# Patient Record
Sex: Female | Born: 1945 | Race: White | Hispanic: No | Marital: Single | Smoking: Never smoker
Health system: Southern US, Community
[De-identification: ages and names within clinical notes are randomized; demographics above are authoritative.]

## PROBLEM LIST (undated history)

## (undated) DIAGNOSIS — C801 Malignant (primary) neoplasm, unspecified: Secondary | ICD-10-CM

## (undated) HISTORY — PX: CHOLECYSTECTOMY: SHX55

## (undated) HISTORY — PX: MASTECTOMY: SHX3

## (undated) HISTORY — PX: HERNIA REPAIR: SHX51

---

## 1997-10-03 ENCOUNTER — Other Ambulatory Visit: Admission: RE | Admit: 1997-10-03 | Discharge: 1997-10-03 | Payer: Self-pay | Admitting: Obstetrics and Gynecology

## 1998-05-22 ENCOUNTER — Other Ambulatory Visit: Admission: RE | Admit: 1998-05-22 | Discharge: 1998-05-22 | Payer: Self-pay | Admitting: Urology

## 1999-09-17 ENCOUNTER — Other Ambulatory Visit: Admission: RE | Admit: 1999-09-17 | Discharge: 1999-09-17 | Payer: Self-pay | Admitting: Obstetrics and Gynecology

## 2000-06-16 ENCOUNTER — Encounter (INDEPENDENT_AMBULATORY_CARE_PROVIDER_SITE_OTHER): Payer: Self-pay | Admitting: Specialist

## 2000-06-16 ENCOUNTER — Other Ambulatory Visit: Admission: RE | Admit: 2000-06-16 | Discharge: 2000-06-16 | Payer: Self-pay | Admitting: Internal Medicine

## 2001-01-19 ENCOUNTER — Other Ambulatory Visit: Admission: RE | Admit: 2001-01-19 | Discharge: 2001-01-19 | Payer: Self-pay | Admitting: Obstetrics and Gynecology

## 2001-04-11 ENCOUNTER — Encounter: Admission: RE | Admit: 2001-04-11 | Discharge: 2001-04-11 | Payer: Self-pay | Admitting: Oncology

## 2001-04-11 ENCOUNTER — Encounter: Payer: Self-pay | Admitting: Oncology

## 2001-07-11 ENCOUNTER — Encounter: Payer: Self-pay | Admitting: Surgery

## 2001-07-11 ENCOUNTER — Observation Stay (HOSPITAL_COMMUNITY): Admission: RE | Admit: 2001-07-11 | Discharge: 2001-07-12 | Payer: Self-pay | Admitting: Surgery

## 2002-01-25 ENCOUNTER — Other Ambulatory Visit: Admission: RE | Admit: 2002-01-25 | Discharge: 2002-01-25 | Payer: Self-pay | Admitting: Obstetrics and Gynecology

## 2003-01-31 ENCOUNTER — Other Ambulatory Visit: Admission: RE | Admit: 2003-01-31 | Discharge: 2003-01-31 | Payer: Self-pay | Admitting: Obstetrics and Gynecology

## 2003-07-30 ENCOUNTER — Encounter: Admission: RE | Admit: 2003-07-30 | Discharge: 2003-07-30 | Payer: Self-pay | Admitting: Oncology

## 2004-03-20 ENCOUNTER — Encounter (INDEPENDENT_AMBULATORY_CARE_PROVIDER_SITE_OTHER): Payer: Self-pay | Admitting: *Deleted

## 2004-03-20 ENCOUNTER — Ambulatory Visit (HOSPITAL_COMMUNITY): Admission: RE | Admit: 2004-03-20 | Discharge: 2004-03-20 | Payer: Self-pay | Admitting: Obstetrics and Gynecology

## 2008-08-10 ENCOUNTER — Inpatient Hospital Stay (HOSPITAL_COMMUNITY): Admission: EM | Admit: 2008-08-10 | Discharge: 2008-08-18 | Payer: Self-pay | Admitting: Emergency Medicine

## 2008-08-10 ENCOUNTER — Ambulatory Visit: Payer: Self-pay | Admitting: Internal Medicine

## 2008-08-11 ENCOUNTER — Ambulatory Visit: Payer: Self-pay | Admitting: Internal Medicine

## 2008-08-13 ENCOUNTER — Encounter (INDEPENDENT_AMBULATORY_CARE_PROVIDER_SITE_OTHER): Payer: Self-pay | Admitting: Internal Medicine

## 2008-08-14 ENCOUNTER — Encounter (INDEPENDENT_AMBULATORY_CARE_PROVIDER_SITE_OTHER): Payer: Self-pay | Admitting: Surgery

## 2008-08-30 ENCOUNTER — Encounter: Payer: Self-pay | Admitting: Internal Medicine

## 2010-06-23 NOTE — Letter (Signed)
Summary: Follow Up/[facility]  Follow Up/[facility]   Imported By: Sherian Rein 09/16/2008 09:24:10  _____________________________________________________________________  External Attachment:    Type:   Image     Comment:   External Document

## 2010-09-03 LAB — DIFFERENTIAL
Basophils Absolute: 0 10*3/uL (ref 0.0–0.1)
Basophils Absolute: 0.1 10*3/uL (ref 0.0–0.1)
Basophils Absolute: 0.1 10*3/uL (ref 0.0–0.1)
Basophils Absolute: 0.1 10*3/uL (ref 0.0–0.1)
Basophils Absolute: 0.2 10*3/uL — ABNORMAL HIGH (ref 0.0–0.1)
Basophils Absolute: 0.2 10*3/uL — ABNORMAL HIGH (ref 0.0–0.1)
Basophils Relative: 0 % (ref 0–1)
Basophils Relative: 0 % (ref 0–1)
Basophils Relative: 0 % (ref 0–1)
Basophils Relative: 0 % (ref 0–1)
Basophils Relative: 1 % (ref 0–1)
Basophils Relative: 1 % (ref 0–1)
Eosinophils Absolute: 0 10*3/uL (ref 0.0–0.7)
Eosinophils Absolute: 0 10*3/uL (ref 0.0–0.7)
Eosinophils Absolute: 0 10*3/uL (ref 0.0–0.7)
Eosinophils Absolute: 0.2 10*3/uL (ref 0.0–0.7)
Eosinophils Absolute: 0.2 10*3/uL (ref 0.0–0.7)
Eosinophils Absolute: 0.3 10*3/uL (ref 0.0–0.7)
Eosinophils Relative: 0 % (ref 0–5)
Eosinophils Relative: 0 % (ref 0–5)
Eosinophils Relative: 0 % (ref 0–5)
Eosinophils Relative: 1 % (ref 0–5)
Eosinophils Relative: 1 % (ref 0–5)
Eosinophils Relative: 2 % (ref 0–5)
Lymphocytes Relative: 4 % — ABNORMAL LOW (ref 12–46)
Lymphocytes Relative: 5 % — ABNORMAL LOW (ref 12–46)
Lymphocytes Relative: 7 % — ABNORMAL LOW (ref 12–46)
Lymphocytes Relative: 7 % — ABNORMAL LOW (ref 12–46)
Lymphocytes Relative: 8 % — ABNORMAL LOW (ref 12–46)
Lymphocytes Relative: 9 % — ABNORMAL LOW (ref 12–46)
Lymphs Abs: 0.6 10*3/uL — ABNORMAL LOW (ref 0.7–4.0)
Lymphs Abs: 0.8 10*3/uL (ref 0.7–4.0)
Lymphs Abs: 1.2 10*3/uL (ref 0.7–4.0)
Lymphs Abs: 1.3 10*3/uL (ref 0.7–4.0)
Lymphs Abs: 1.4 10*3/uL (ref 0.7–4.0)
Lymphs Abs: 1.6 10*3/uL (ref 0.7–4.0)
Monocytes Absolute: 0.8 10*3/uL (ref 0.1–1.0)
Monocytes Absolute: 0.9 10*3/uL (ref 0.1–1.0)
Monocytes Absolute: 0.9 10*3/uL (ref 0.1–1.0)
Monocytes Absolute: 1 10*3/uL (ref 0.1–1.0)
Monocytes Absolute: 1 10*3/uL (ref 0.1–1.0)
Monocytes Absolute: 1.5 10*3/uL — ABNORMAL HIGH (ref 0.1–1.0)
Monocytes Relative: 5 % (ref 3–12)
Monocytes Relative: 5 % (ref 3–12)
Monocytes Relative: 5 % (ref 3–12)
Monocytes Relative: 6 % (ref 3–12)
Monocytes Relative: 6 % (ref 3–12)
Monocytes Relative: 8 % (ref 3–12)
Neutro Abs: 13.6 10*3/uL — ABNORMAL HIGH (ref 1.7–7.7)
Neutro Abs: 14.3 10*3/uL — ABNORMAL HIGH (ref 1.7–7.7)
Neutro Abs: 14.5 10*3/uL — ABNORMAL HIGH (ref 1.7–7.7)
Neutro Abs: 14.7 10*3/uL — ABNORMAL HIGH (ref 1.7–7.7)
Neutro Abs: 15.1 10*3/uL — ABNORMAL HIGH (ref 1.7–7.7)
Neutro Abs: 15.7 10*3/uL — ABNORMAL HIGH (ref 1.7–7.7)
Neutrophils Relative %: 84 % — ABNORMAL HIGH (ref 43–77)
Neutrophils Relative %: 84 % — ABNORMAL HIGH (ref 43–77)
Neutrophils Relative %: 85 % — ABNORMAL HIGH (ref 43–77)
Neutrophils Relative %: 86 % — ABNORMAL HIGH (ref 43–77)
Neutrophils Relative %: 90 % — ABNORMAL HIGH (ref 43–77)
Neutrophils Relative %: 90 % — ABNORMAL HIGH (ref 43–77)

## 2010-09-03 LAB — COMPREHENSIVE METABOLIC PANEL
ALT: 143 U/L — ABNORMAL HIGH (ref 0–35)
ALT: 271 U/L — ABNORMAL HIGH (ref 0–35)
ALT: 32 U/L (ref 0–35)
ALT: 379 U/L — ABNORMAL HIGH (ref 0–35)
ALT: 41 U/L — ABNORMAL HIGH (ref 0–35)
ALT: 53 U/L — ABNORMAL HIGH (ref 0–35)
ALT: 60 U/L — ABNORMAL HIGH (ref 0–35)
ALT: 67 U/L — ABNORMAL HIGH (ref 0–35)
ALT: 86 U/L — ABNORMAL HIGH (ref 0–35)
AST: 195 U/L — ABNORMAL HIGH (ref 0–37)
AST: 22 U/L (ref 0–37)
AST: 23 U/L (ref 0–37)
AST: 326 U/L — ABNORMAL HIGH (ref 0–37)
AST: 34 U/L (ref 0–37)
AST: 43 U/L — ABNORMAL HIGH (ref 0–37)
AST: 53 U/L — ABNORMAL HIGH (ref 0–37)
AST: 71 U/L — ABNORMAL HIGH (ref 0–37)
AST: 74 U/L — ABNORMAL HIGH (ref 0–37)
Albumin: 2 g/dL — ABNORMAL LOW (ref 3.5–5.2)
Albumin: 2.2 g/dL — ABNORMAL LOW (ref 3.5–5.2)
Albumin: 2.2 g/dL — ABNORMAL LOW (ref 3.5–5.2)
Albumin: 2.2 g/dL — ABNORMAL LOW (ref 3.5–5.2)
Albumin: 2.3 g/dL — ABNORMAL LOW (ref 3.5–5.2)
Albumin: 2.5 g/dL — ABNORMAL LOW (ref 3.5–5.2)
Albumin: 2.9 g/dL — ABNORMAL LOW (ref 3.5–5.2)
Albumin: 3.6 g/dL (ref 3.5–5.2)
Albumin: 4.3 g/dL (ref 3.5–5.2)
Alkaline Phosphatase: 130 U/L — ABNORMAL HIGH (ref 39–117)
Alkaline Phosphatase: 163 U/L — ABNORMAL HIGH (ref 39–117)
Alkaline Phosphatase: 74 U/L (ref 39–117)
Alkaline Phosphatase: 77 U/L (ref 39–117)
Alkaline Phosphatase: 84 U/L (ref 39–117)
Alkaline Phosphatase: 87 U/L (ref 39–117)
Alkaline Phosphatase: 91 U/L (ref 39–117)
Alkaline Phosphatase: 92 U/L (ref 39–117)
Alkaline Phosphatase: 95 U/L (ref 39–117)
BUN: 13 mg/dL (ref 6–23)
BUN: 13 mg/dL (ref 6–23)
BUN: 18 mg/dL (ref 6–23)
BUN: 20 mg/dL (ref 6–23)
BUN: 21 mg/dL (ref 6–23)
BUN: 23 mg/dL (ref 6–23)
BUN: 23 mg/dL (ref 6–23)
BUN: 25 mg/dL — ABNORMAL HIGH (ref 6–23)
BUN: 8 mg/dL (ref 6–23)
CO2: 20 mEq/L (ref 19–32)
CO2: 21 mEq/L (ref 19–32)
CO2: 22 mEq/L (ref 19–32)
CO2: 22 mEq/L (ref 19–32)
CO2: 23 mEq/L (ref 19–32)
CO2: 25 mEq/L (ref 19–32)
CO2: 26 mEq/L (ref 19–32)
CO2: 27 mEq/L (ref 19–32)
CO2: 29 mEq/L (ref 19–32)
Calcium: 7.4 mg/dL — ABNORMAL LOW (ref 8.4–10.5)
Calcium: 7.5 mg/dL — ABNORMAL LOW (ref 8.4–10.5)
Calcium: 7.5 mg/dL — ABNORMAL LOW (ref 8.4–10.5)
Calcium: 7.6 mg/dL — ABNORMAL LOW (ref 8.4–10.5)
Calcium: 7.6 mg/dL — ABNORMAL LOW (ref 8.4–10.5)
Calcium: 7.8 mg/dL — ABNORMAL LOW (ref 8.4–10.5)
Calcium: 7.9 mg/dL — ABNORMAL LOW (ref 8.4–10.5)
Calcium: 8 mg/dL — ABNORMAL LOW (ref 8.4–10.5)
Calcium: 9.7 mg/dL (ref 8.4–10.5)
Chloride: 102 mEq/L (ref 96–112)
Chloride: 103 mEq/L (ref 96–112)
Chloride: 104 mEq/L (ref 96–112)
Chloride: 105 mEq/L (ref 96–112)
Chloride: 109 mEq/L (ref 96–112)
Chloride: 110 mEq/L (ref 96–112)
Chloride: 111 mEq/L (ref 96–112)
Chloride: 112 mEq/L (ref 96–112)
Chloride: 114 mEq/L — ABNORMAL HIGH (ref 96–112)
Creatinine, Ser: 0.95 mg/dL (ref 0.4–1.2)
Creatinine, Ser: 0.96 mg/dL (ref 0.4–1.2)
Creatinine, Ser: 0.99 mg/dL (ref 0.4–1.2)
Creatinine, Ser: 1.07 mg/dL (ref 0.4–1.2)
Creatinine, Ser: 1.08 mg/dL (ref 0.4–1.2)
Creatinine, Ser: 1.13 mg/dL (ref 0.4–1.2)
Creatinine, Ser: 1.14 mg/dL (ref 0.4–1.2)
Creatinine, Ser: 1.15 mg/dL (ref 0.4–1.2)
Creatinine, Ser: 1.22 mg/dL — ABNORMAL HIGH (ref 0.4–1.2)
GFR calc Af Amer: 54 mL/min — ABNORMAL LOW (ref >60)
GFR calc Af Amer: 57 mL/min — ABNORMAL LOW (ref >60)
GFR calc Af Amer: 58 mL/min — ABNORMAL LOW (ref >60)
GFR calc Af Amer: 58 mL/min — ABNORMAL LOW (ref >60)
GFR calc Af Amer: >60 mL/min (ref >60)
GFR calc Af Amer: >60 mL/min (ref >60)
GFR calc Af Amer: >60 mL/min (ref >60)
GFR calc Af Amer: >60 mL/min (ref >60)
GFR calc Af Amer: >60 mL/min (ref >60)
GFR calc non Af Amer: 44 mL/min — ABNORMAL LOW (ref >60)
GFR calc non Af Amer: 47 mL/min — ABNORMAL LOW (ref >60)
GFR calc non Af Amer: 48 mL/min — ABNORMAL LOW (ref >60)
GFR calc non Af Amer: 48 mL/min — ABNORMAL LOW (ref >60)
GFR calc non Af Amer: 51 mL/min — ABNORMAL LOW (ref >60)
GFR calc non Af Amer: 51 mL/min — ABNORMAL LOW (ref >60)
GFR calc non Af Amer: 56 mL/min — ABNORMAL LOW (ref >60)
GFR calc non Af Amer: 58 mL/min — ABNORMAL LOW (ref >60)
GFR calc non Af Amer: 59 mL/min — ABNORMAL LOW (ref >60)
Glucose, Bld: 143 mg/dL — ABNORMAL HIGH (ref 70–99)
Glucose, Bld: 212 mg/dL — ABNORMAL HIGH (ref 70–99)
Glucose, Bld: 247 mg/dL — ABNORMAL HIGH (ref 70–99)
Glucose, Bld: 257 mg/dL — ABNORMAL HIGH (ref 70–99)
Glucose, Bld: 279 mg/dL — ABNORMAL HIGH (ref 70–99)
Glucose, Bld: 290 mg/dL — ABNORMAL HIGH (ref 70–99)
Glucose, Bld: 291 mg/dL — ABNORMAL HIGH (ref 70–99)
Glucose, Bld: 307 mg/dL — ABNORMAL HIGH (ref 70–99)
Glucose, Bld: 327 mg/dL — ABNORMAL HIGH (ref 70–99)
Potassium: 3.4 mEq/L — ABNORMAL LOW (ref 3.5–5.1)
Potassium: 3.5 mEq/L (ref 3.5–5.1)
Potassium: 3.5 mEq/L (ref 3.5–5.1)
Potassium: 3.6 mEq/L (ref 3.5–5.1)
Potassium: 4.1 mEq/L (ref 3.5–5.1)
Potassium: 4.2 mEq/L (ref 3.5–5.1)
Potassium: 4.6 mEq/L (ref 3.5–5.1)
Potassium: 4.6 mEq/L (ref 3.5–5.1)
Potassium: 5.5 mEq/L — ABNORMAL HIGH (ref 3.5–5.1)
Sodium: 136 mEq/L (ref 135–145)
Sodium: 139 mEq/L (ref 135–145)
Sodium: 139 mEq/L (ref 135–145)
Sodium: 139 mEq/L (ref 135–145)
Sodium: 141 mEq/L (ref 135–145)
Sodium: 142 mEq/L (ref 135–145)
Sodium: 142 mEq/L (ref 135–145)
Sodium: 142 mEq/L (ref 135–145)
Sodium: 143 mEq/L (ref 135–145)
Total Bilirubin: 0.8 mg/dL (ref 0.3–1.2)
Total Bilirubin: 1.1 mg/dL (ref 0.3–1.2)
Total Bilirubin: 1.2 mg/dL (ref 0.3–1.2)
Total Bilirubin: 1.5 mg/dL — ABNORMAL HIGH (ref 0.3–1.2)
Total Bilirubin: 2.2 mg/dL — ABNORMAL HIGH (ref 0.3–1.2)
Total Bilirubin: 2.3 mg/dL — ABNORMAL HIGH (ref 0.3–1.2)
Total Bilirubin: 3.2 mg/dL — ABNORMAL HIGH (ref 0.3–1.2)
Total Bilirubin: 4.3 mg/dL — ABNORMAL HIGH (ref 0.3–1.2)
Total Bilirubin: 5.1 mg/dL — ABNORMAL HIGH (ref 0.3–1.2)
Total Protein: 4.7 g/dL — ABNORMAL LOW (ref 6.0–8.3)
Total Protein: 5 g/dL — ABNORMAL LOW (ref 6.0–8.3)
Total Protein: 5.3 g/dL — ABNORMAL LOW (ref 6.0–8.3)
Total Protein: 5.4 g/dL — ABNORMAL LOW (ref 6.0–8.3)
Total Protein: 5.7 g/dL — ABNORMAL LOW (ref 6.0–8.3)
Total Protein: 5.9 g/dL — ABNORMAL LOW (ref 6.0–8.3)
Total Protein: 6 g/dL (ref 6.0–8.3)
Total Protein: 6.7 g/dL (ref 6.0–8.3)
Total Protein: 8 g/dL (ref 6.0–8.3)

## 2010-09-03 LAB — GLUCOSE, CAPILLARY
Glucose-Capillary: 182 mg/dL — ABNORMAL HIGH (ref 70–99)
Glucose-Capillary: 221 mg/dL — ABNORMAL HIGH (ref 70–99)
Glucose-Capillary: 226 mg/dL — ABNORMAL HIGH (ref 70–99)
Glucose-Capillary: 230 mg/dL — ABNORMAL HIGH (ref 70–99)
Glucose-Capillary: 236 mg/dL — ABNORMAL HIGH (ref 70–99)
Glucose-Capillary: 238 mg/dL — ABNORMAL HIGH (ref 70–99)
Glucose-Capillary: 239 mg/dL — ABNORMAL HIGH (ref 70–99)
Glucose-Capillary: 240 mg/dL — ABNORMAL HIGH (ref 70–99)
Glucose-Capillary: 251 mg/dL — ABNORMAL HIGH (ref 70–99)
Glucose-Capillary: 264 mg/dL — ABNORMAL HIGH (ref 70–99)
Glucose-Capillary: 264 mg/dL — ABNORMAL HIGH (ref 70–99)
Glucose-Capillary: 273 mg/dL — ABNORMAL HIGH (ref 70–99)
Glucose-Capillary: 289 mg/dL — ABNORMAL HIGH (ref 70–99)
Glucose-Capillary: 289 mg/dL — ABNORMAL HIGH (ref 70–99)
Glucose-Capillary: 289 mg/dL — ABNORMAL HIGH (ref 70–99)
Glucose-Capillary: 292 mg/dL — ABNORMAL HIGH (ref 70–99)
Glucose-Capillary: 304 mg/dL — ABNORMAL HIGH (ref 70–99)
Glucose-Capillary: 312 mg/dL — ABNORMAL HIGH (ref 70–99)
Glucose-Capillary: 315 mg/dL — ABNORMAL HIGH (ref 70–99)
Glucose-Capillary: 317 mg/dL — ABNORMAL HIGH (ref 70–99)
Glucose-Capillary: 323 mg/dL — ABNORMAL HIGH (ref 70–99)
Glucose-Capillary: 328 mg/dL — ABNORMAL HIGH (ref 70–99)
Glucose-Capillary: 328 mg/dL — ABNORMAL HIGH (ref 70–99)
Glucose-Capillary: 328 mg/dL — ABNORMAL HIGH (ref 70–99)
Glucose-Capillary: 330 mg/dL — ABNORMAL HIGH (ref 70–99)
Glucose-Capillary: 354 mg/dL — ABNORMAL HIGH (ref 70–99)
Glucose-Capillary: 357 mg/dL — ABNORMAL HIGH (ref 70–99)
Glucose-Capillary: 360 mg/dL — ABNORMAL HIGH (ref 70–99)
Glucose-Capillary: 366 mg/dL — ABNORMAL HIGH (ref 70–99)

## 2010-09-03 LAB — CBC
HCT: 30.6 % — ABNORMAL LOW (ref 36.0–46.0)
HCT: 30.9 % — ABNORMAL LOW (ref 36.0–46.0)
HCT: 31 % — ABNORMAL LOW (ref 36.0–46.0)
HCT: 32.7 % — ABNORMAL LOW (ref 36.0–46.0)
HCT: 33.9 % — ABNORMAL LOW (ref 36.0–46.0)
HCT: 34.4 % — ABNORMAL LOW (ref 36.0–46.0)
HCT: 39.9 % (ref 36.0–46.0)
HCT: 45.6 % (ref 36.0–46.0)
HCT: 49.5 % — ABNORMAL HIGH (ref 36.0–46.0)
Hemoglobin: 10.4 g/dL — ABNORMAL LOW (ref 12.0–15.0)
Hemoglobin: 10.4 g/dL — ABNORMAL LOW (ref 12.0–15.0)
Hemoglobin: 10.5 g/dL — ABNORMAL LOW (ref 12.0–15.0)
Hemoglobin: 10.9 g/dL — ABNORMAL LOW (ref 12.0–15.0)
Hemoglobin: 11.5 g/dL — ABNORMAL LOW (ref 12.0–15.0)
Hemoglobin: 11.6 g/dL — ABNORMAL LOW (ref 12.0–15.0)
Hemoglobin: 13.6 g/dL (ref 12.0–15.0)
Hemoglobin: 15.4 g/dL — ABNORMAL HIGH (ref 12.0–15.0)
Hemoglobin: 16.6 g/dL — ABNORMAL HIGH (ref 12.0–15.0)
MCHC: 33.4 g/dL (ref 30.0–36.0)
MCHC: 33.5 g/dL (ref 30.0–36.0)
MCHC: 33.5 g/dL (ref 30.0–36.0)
MCHC: 33.5 g/dL (ref 30.0–36.0)
MCHC: 33.8 g/dL (ref 30.0–36.0)
MCHC: 33.8 g/dL (ref 30.0–36.0)
MCHC: 34 g/dL (ref 30.0–36.0)
MCHC: 34.2 g/dL (ref 30.0–36.0)
MCHC: 34.4 g/dL (ref 30.0–36.0)
MCV: 92.9 fL (ref 78.0–100.0)
MCV: 93.1 fL (ref 78.0–100.0)
MCV: 93.7 fL (ref 78.0–100.0)
MCV: 93.8 fL (ref 78.0–100.0)
MCV: 93.9 fL (ref 78.0–100.0)
MCV: 94.2 fL (ref 78.0–100.0)
MCV: 94.3 fL (ref 78.0–100.0)
MCV: 94.7 fL (ref 78.0–100.0)
MCV: 95.3 fL (ref 78.0–100.0)
Platelets: 202 10*3/uL (ref 150–400)
Platelets: 206 10*3/uL (ref 150–400)
Platelets: 213 10*3/uL (ref 150–400)
Platelets: 226 10*3/uL (ref 150–400)
Platelets: 228 10*3/uL (ref 150–400)
Platelets: 242 10*3/uL (ref 150–400)
Platelets: 256 10*3/uL (ref 150–400)
Platelets: 263 10*3/uL (ref 150–400)
Platelets: 303 10*3/uL (ref 150–400)
RBC: 3.25 MIL/uL — ABNORMAL LOW (ref 3.87–5.11)
RBC: 3.27 MIL/uL — ABNORMAL LOW (ref 3.87–5.11)
RBC: 3.28 MIL/uL — ABNORMAL LOW (ref 3.87–5.11)
RBC: 3.49 MIL/uL — ABNORMAL LOW (ref 3.87–5.11)
RBC: 3.61 MIL/uL — ABNORMAL LOW (ref 3.87–5.11)
RBC: 3.64 MIL/uL — ABNORMAL LOW (ref 3.87–5.11)
RBC: 4.3 MIL/uL (ref 3.87–5.11)
RBC: 4.85 MIL/uL (ref 3.87–5.11)
RBC: 5.28 MIL/uL — ABNORMAL HIGH (ref 3.87–5.11)
RDW: 13.6 % (ref 11.5–15.5)
RDW: 13.6 % (ref 11.5–15.5)
RDW: 13.9 % (ref 11.5–15.5)
RDW: 14.1 % (ref 11.5–15.5)
RDW: 14.2 % (ref 11.5–15.5)
RDW: 14.2 % (ref 11.5–15.5)
RDW: 14.3 % (ref 11.5–15.5)
RDW: 14.4 % (ref 11.5–15.5)
RDW: 14.4 % (ref 11.5–15.5)
WBC: 13.6 10*3/uL — ABNORMAL HIGH (ref 4.0–10.5)
WBC: 13.7 10*3/uL — ABNORMAL HIGH (ref 4.0–10.5)
WBC: 15.2 10*3/uL — ABNORMAL HIGH (ref 4.0–10.5)
WBC: 16.7 10*3/uL — ABNORMAL HIGH (ref 4.0–10.5)
WBC: 17.2 10*3/uL — ABNORMAL HIGH (ref 4.0–10.5)
WBC: 17.3 10*3/uL — ABNORMAL HIGH (ref 4.0–10.5)
WBC: 17.5 10*3/uL — ABNORMAL HIGH (ref 4.0–10.5)
WBC: 18.1 10*3/uL — ABNORMAL HIGH (ref 4.0–10.5)
WBC: 18.4 10*3/uL — ABNORMAL HIGH (ref 4.0–10.5)

## 2010-09-03 LAB — LIPASE, BLOOD
Lipase: 1249 U/L — ABNORMAL HIGH (ref 11–59)
Lipase: 1368 U/L — ABNORMAL HIGH (ref 11–59)
Lipase: 21 U/L (ref 11–59)
Lipase: 26 U/L (ref 11–59)
Lipase: 27 U/L (ref 11–59)
Lipase: 30 U/L (ref 11–59)
Lipase: 341 U/L — ABNORMAL HIGH (ref 11–59)
Lipase: 79 U/L — ABNORMAL HIGH (ref 11–59)
Lipase: >2000 U/L — ABNORMAL HIGH (ref 11–59)

## 2010-09-03 LAB — URINALYSIS, ROUTINE W REFLEX MICROSCOPIC
Glucose, UA: 500 mg/dL — AB
Glucose, UA: NEGATIVE mg/dL
Hgb urine dipstick: NEGATIVE
Ketones, ur: 15 mg/dL — AB
Nitrite: NEGATIVE
Nitrite: POSITIVE — AB
Protein, ur: >300 mg/dL — AB
Protein, ur: NEGATIVE mg/dL
Specific Gravity, Urine: 1.018 (ref 1.005–1.030)
Specific Gravity, Urine: 1.029 (ref 1.005–1.030)
Urobilinogen, UA: 1 mg/dL (ref 0.0–1.0)
Urobilinogen, UA: 1 mg/dL (ref 0.0–1.0)
pH: 5.5 (ref 5.0–8.0)
pH: 6 (ref 5.0–8.0)

## 2010-09-03 LAB — URINE MICROSCOPIC-ADD ON

## 2010-09-03 LAB — CULTURE, BLOOD (ROUTINE X 2)
Culture: NO GROWTH
Culture: NO GROWTH

## 2010-09-03 LAB — MAGNESIUM: Magnesium: 1.9 mg/dL (ref 1.5–2.5)

## 2010-09-03 LAB — URINE CULTURE
Colony Count: NO GROWTH
Culture: NO GROWTH
Special Requests: NEGATIVE

## 2010-09-03 LAB — HEMOGLOBIN A1C
Hgb A1c MFr Bld: 5.8 % (ref 4.6–6.1)
Mean Plasma Glucose: 120 mg/dL

## 2010-09-03 LAB — LACTIC ACID, PLASMA: Lactic Acid, Venous: 2.4 mmol/L — ABNORMAL HIGH (ref 0.5–2.2)

## 2010-09-03 LAB — TROPONIN I: Troponin I: 0.02 ng/mL (ref 0.00–0.06)

## 2010-09-03 LAB — TSH: TSH: 0.76 u[IU]/mL (ref 0.350–4.500)

## 2010-10-06 NOTE — H&P (Signed)
NAMELANDYN, LORINCZ               ACCOUNT NO.:  [id number]   MEDICAL RECORD NO.:  [id number]          PATIENT TYPE:  EMS   LOCATION:  ED                           FACILITY:  [facility]   PHYSICIAN:  Georgina Quint. Plotnikov, MDDATE OF BIRTH:  January 26, 1946   DATE OF ADMISSION:  08/10/2008  DATE OF DISCHARGE:                              HISTORY & PHYSICAL   CHIEF COMPLAINT:  Abdominal pain.   HISTORY OF PRESENT ILLNESS:  The patient is a 65 year old female who  started to have upper abdominal and low back pain on Thursday, has  resolved, and resumed on Friday.  She attributes the pain to working in  the yard.  The pain has gotten progressively worse and was unbearable  today starting morning hours.  She had a couple toasts for breakfast  with cranberry juice.  It all came up.  She vomited about 6 times.  She  had very severe abdominal pain radiating into the back.  She rated it  10/10.  There was no blood in the emesis.  No diarrhea.  No chest pain.  No syncope.  She has been feeling tired.  Dilaudid given in the  emergency room along with nausea medication with minimal relief.  She  got Zosyn intravenously.   PAST MEDICAL HISTORY:  1. Hypertension.  2. Hypothyroidism.  3. Breast cancer 20 years ago with recurrence.  4. Frequent urinary tract infections.   SURGERIES:  1. Hernia.  2. Breast reconstruction bilaterally.   ALLERGIES:  NONE.   MEDICINES:  1. Lisinopril HCT 10/12.5 daily.  2. Levoxyl 75 mcg daily.  3. Clorazepate 7.5 mg at h.s.  4. Bactrim p.r.n. UTIs.   FAMILY HISTORY:  Negative for gallstones.   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  She is married.   REVIEW OF SYSTEMS:  No chest pain or shortness of breath.  No syncope.  GI symptoms as above.  The rest of the 18-point review of systems is  negative.   PHYSICAL:  Temp 97.2.  Blood pressure 108/30.  Heart rate 56.  Respirations 20.  Saturations 92%.  She appears in mild acute  distress  due to pain.  HEENT:  With dry lips and dry oral mucosa.  No jaundice.  NECK:  Supple.  No meningeal signs.  No thyromegaly.  LUNGS:  Clear.  No wheezes or rales.  HEART:  S1 and S2.  No gallop.  She is bradycardic.  No murmur.  ABDOMEN:  Slightly distended.  Tender in the upper mid section.  No  hernias.  No masses.  No rebound symptoms.  LS SPINE:  Without tenderness.  LOWER EXTREMITIES:  Without edema.  Calves nontender.  SKIN:  Without rashes.  She is alert, oriented, and cooperative.  Cranial nerves II-XII  nonfocal.  Deep tendinous muscle strength within normal limits.   LABS:  EKG with sinus bradycardia.  White count 17.2 thousand, MCV 93.9,  platelets 303,000, hemoglobin 16.6, sodium 141, potassium 4.6,  creatinine 0.99, glucose 143, BUN 8, AST 326, ALT 379, troponin 0.02,  lactic acid 2.4, lipase  greater than 2000.  Urinalysis with 3 to 6 WBCs.  KUB, nonobstructive bowel gas pattern.  Lungs clear.  Abdominal  ultrasound, gallstones in gallbladder, common bile duct 7.8 mm, fatty  liver infiltration.   ASSESSMENT AND PLAN:  1. Acute pancreatitis, likely gallstone related.  We will admit to      step down.  N.p.o.  Intravenous fluids.  We will obtain      gastrointestinal consult.  2. Elevated liver function tests due to the above.  We will obtain      liver tests tomorrow including bilirubin and alk phos.  3. Gallstones in gallbladder.  Plan as above.  May need magnetic      resonance cholangiopancreatography/endoscopic retrograde      cholangiopancreatography.  4. Nausea/vomiting.  We will treat with Zofran.  5. Dehydration.  Intravenous fluids.  6. Elevated lactic acid.  Plan as above.  7. Abdominal pain due to #1.  Plan as above.  We will cover with      Zosyn.  8. Elevated white cell count.  Plan as above.      Georgina Quint. Plotnikov, MD  Electronically Signed     AVP/MEDQ  D:  08/10/2008  T:  08/11/2008  Job:  [id number]   cc:   Molly Maduro L. Foy [place], M.D.   Fax: [phone]   Hedwig Morton. Juanda Chance, MD  [address]  [place]  [place] [address]

## 2010-10-06 NOTE — Consult Note (Signed)
[name], [name]               ACCOUNT NO.:  [id number]   MEDICAL RECORD NO.:  [id number]          PATIENT TYPE:  INP   LOCATION:  1226                         FACILITY:  [facility]   PHYSICIAN:  Iva Boop, MD,FACGDATE OF BIRTH:  1945-09-26   DATE OF CONSULTATION:  08/11/2008  DATE OF DISCHARGE:                                 CONSULTATION   REQUESTING PHYSICIAN:  Georgina Quint. Plotnikov, MD.  He was covering for  Dr. Marinda Elk who is the primary care physician.   REASON FOR CONSULTATION:  Pancreatitis and gallstones.   ASSESSMENT:  [age]-year-old white woman with gallstone  pancreatitis.  She is improved at this point.  She has a 7.8 mm common  bile duct.  She had a bilirubin of 5.1, this is down to 4.3 today.  Her  lipase is down to 1368 from greater than 2000.   At this point it is not entirely clear whether she has passed a stone or  has a retained common bile duct stone.  The trend is towards improving  liver function studies, would suggest that she has passed it.   PLAN:  Continue supportive care with pain control.  She is on  intravenous Zosyn for antibiotic coverage of the biliary sepsis.  Dr.  Michaell Cowing of Surgery has been consulted.  We will observe how the patient  does over time and decide whether or not a preoperative endoscopic  retrograde cholangiopancreatography or other imaging would be indicated,  it is too soon to tell for that and I do not think surgery or endoscopic  retrograde cholangiopancreatography is indicated today.  I have  discussed this with Dr. Michaell Cowing and he will come to see the patient later.   HISTORY:  [age]-year-old white woman who was in her usual state of  health until about 4 days prior to admission when she had right upper  quadrant pain radiating into the abdomen, some in the back.  She took  some Tylenol and felt better.  Tums did not help.  She has lost her  appetite and yesterday she had sudden severe increase in the pain  with  nausea, vomiting and sweats.  She presented to the emergency department  where workup revealed the above findings.  She was admitted to the  hospital and placed on a morphine PCA and that is helping, but she was  still having a fair amount of pain.  The nausea and vomiting appear to  have subsided.  She has not been febrile.   PAST MEDICAL HISTORY:  1. Breast cancer with mastectomy and trans flap reconstruction late      1980s.  2. Ventral hernia repair with mesh, Dr. Jamey Ripa.  3. Hypothyroidism.  4. Obesity.  5. Uterine endometrial polyps.   MEDICATIONS:  1. Morphine PCA.  2. Protonix 40 mg IV.  3. NovoLog insulin.  4. Synthroid is being held.  5. Topical hydrocortisone.  6. Catapres patch.   ALLERGIES:  ALLOPURINOL AND ANTIBIOTICS SENSITIVITIES.  Details not  listed.  No  true drug allergies though.   SOCIAL HISTORY:  Homemaker, nonsmoker, married.  She lives  in  [place].  Rare alcohol.   FAMILY HISTORY:  Mother had breast cancer.  She had a son die of colon  cancer.  Diabetes in the family as well.   REVIEW OF SYSTEMS:  All other systems negative except as mentioned in  the HPI.   PHYSICAL EXAM:  Obese white woman in some pain but no acute distress.  Temperature 98.5, pulse 89 and respirations 18.  Weight 105.5 kg.  Blood  pressure is 152/56.  The eyes are anicteric.  Mouth clear and moist.  No lesions.  NECK:  Without mass.  LUNGS:  Clear.  HEART:  S1, S2.  I hear no rubs, murmurs or gallops.  ABDOMEN:  Obese, soft.  She is moderately tender in the epigastric area  and upper quadrants without organomegaly, mass or guarding.  Bowel  sounds are decreased.  EXTREMITIES:  Without cyanosis, clubbing or edema.  SKIN:  Without jaundice.  NEUROLOGIC:  She is alert and oriented x4.  Moves all extremities well.  PSYCHIATRIC/PSYCHOLOGIC:  Affect appropriate.   LABORATORY DATA:  Her laboratories as reflected above in the assessment.  Additionally, alk phos today  130, AST 195, ALT 271.  Yesterday those  numbers were 163, 326 and 379.  She had a troponin and a lactic acid  that were negative at 2.4 (slightly high) negative.  Her urinalysis:  Moderate bilirubin, otherwise negative.  White count 13.6 today.  It was  17.2 yesterday.  Platelets and hemoglobin are normal.  Magnesium 1.9.   IMAGING:  Ultrasound:  7.8 mm common bile duct gallstones.  No  thickening of the gallbladder wall or sonographic Murphy's sign.   EKG:  Sinus bradycardia, some junctional ST depression.   NOTE:  Family history indicates her son died of colon cancer.  The  patient had a colonoscopy in 2002.  If that was her last one, she is  overdue and at some point electively she should have another  colonoscopy.      Iva Boop, MD,FACG  Electronically Signed     CEG/MEDQ  D:  08/11/2008  T:  08/11/2008  Job:  [id number]   cc:   Molly Maduro L. Foy [place], M.D.  Fax: [phone]

## 2010-10-06 NOTE — Op Note (Signed)
[name], [name]               ACCOUNT NO.:  [id number]   MEDICAL RECORD NO.:  [id number]          PATIENT TYPE:  INP   LOCATION:  1402                         [facility]AC[facility]   PHYSICIAN:  Ardeth Sportsman, MD     DATE OF BIRTH:  March 23, 1946   DATE OF PROCEDURE:  08/14/2008  DATE OF DISCHARGE:                               OPERATIVE REPORT   PRIMARY CARE PHYSICIAN:  Dr. Marinda Elk.   GASTROENTEROLOGIST:  Dr. Lina Sar, although the requesting physician  is Dr. Stan Head with [facility]   SURGEON:  Karie Soda.   ASSISTANT:  Harden Mo.   PREOPERATIVE DIAGNOSES:  1. Gallstone pancreatitis.  2. Morbid obesity.  3. Breast cancer status post bilateral mastectomy and TRAM flaps.   POSTOPERATIVE DIAGNOSES:  1. Gallstone pancreatitis.  2. Morbid obesity.  3. Breast cancer status post bilateral mastectomy and TRAM flaps.  4. Acute cholecystitis.  5. Visceral and parietal 1 to 3-mm peritoneal masses (? fat necrosis).   PROCEDURE PERFORMED:  1. Laparoscopic cholecystectomy.  2. Laparoscopic intraoperative cholangiogram.  3. Biopsies of visceral and parietal peritoneal masses (one of each).  4. Laparoscopic repair of cholangiocatheter bile duct injury at the      cystic duct/common bile duct junction.   ANESTHESIA:  1. General anesthesia.  2. Local anesthetic in a field block around port sites.   SPECIMENS:  1. Gallbladder.  2. Visceral peritoneum of the anterior greater omentum near the      splenic flexure.  3. Peritoneal peritoneum of the anterior abdominal wall just      underneath the rib cage anterior to the right lobe of the liver.   DRAINS:  A 19-French Blake drain rests along the right paracolic gutter  behind the liver and running underneath the right liver lobe liver edge  until the tip this sitting underneath the falciform ligament.  It comes  out of the right upper quadrant former port site.   ESTIMATED BLOOD LOSS:  100 mL.   COMPLICATIONS:  There is a catheter injury from the cholangiogram of the  cystic duct/common bile duct junction.  It was repaired with a 4-0 PDS  laparoscopic using intracorporeal suturing.  See below.   INDICATIONS:  [name] is a 65 year old female morbidly obese with  severe abdominal pain, 4-day history of abdominal pain that worsened.  She came to the ER with elevated lipase and liver function tests and an  showing stones but no gallbladder wall thickening.  This was consistent  with acute cholecystitis.  She was admitted and placed on antibiotics  and hydrated.  She was switched over on her antibiotics.  GI  consultation was made.  Dr. Leone Payor requested my involvement.  The  patient's lipase and liver function tests gradually decreased.  She had  a touch of mild renal failure but improved with hydration.   Today, her lipase is 130, and her total bilirubin is 2.2, down from a  peak in the 5's.  Anatomy and physiology of hepatobiliary and pancreatic  function was discussed.  Pathophysiology of gallstone pancreatitis was  discussed.  Options  discussed and recommendations made for laparoscopic  cholecystectomy, possible open resection with intraoperative  cholangiogram.  Risks, benefits and alternatives were discussed.  Questions answered.  I repeat, I did this twice when I initially saw her  and then again the following day and in the presence of family as well.  Questions answered, and they agreed to proceed.   FINDINGS:  She had gallbladder wall thickening consistent with acute on  chronic cholecystitis.  Her liver did not seem to be markedly inflamed.   She had a leak after the catheter was placed consistent with a catheter  injury at the cystic duct backwall/common bile duct junction.  This was  repaired laparoscopically.  Post repair, cholangiogram was normal.  There was no evidence any persistent choledocholithiasis, strictures or  other abnormalities.   She did have  numerous peritoneal nodules that were rather thin and flat  that seemed most consistent with fat necrosis.  Given her history of  breast cancer and pancreatitis,  I felt biopsy was warranted.   She had numerous infraumbilical intra-abdominal adhesions from her prior  surgeries, and I did not more aggressively pursue these.  She did have a  little bit of bloody peritonitis and serosanguineous peritoneal fluid  when I entered of the abdomen consistent with probable hemorrhagic  pancreatitis.   DESCRIPTION OF PROCEDURE:  Informed consent was confirmed.  The patient  was already on IV Rocephin for concern about urinary and pulmonary  issues.  I added IV ciprofloxacin, and this was given just prior  surgery.  She had sequential compression devices active during the  entire case.  She had been on some subcutaneous heparin, and this was  just held just prior to surgery.  She underwent anesthesia without any  difficulty.  She had voided just prior to coming to the operating room.  At the end of the case, a Foley catheter was placed.  The patient was  positioned supine, both arms tucked.  Her abdomen was prepped and draped  in sterile fashion.  A surgical time out confirmed our plan.   A 5-mm port was positioned in the right upper quadrant with the patient  in steep reversed Trendelenburg and right side up.  Camera inspection  revealed a very dilated mesocolon with a limited working space but no  evidence of intra-abdominal injury.  Under direct visualization, a 5-mm  port was placed on the right anterior axillary line along the subcostal  ridge.  Another 10-mm port was tunneled through the falciform ligament  in the epigastric region.  Another 5-mm port was placed in the  epigastric region.   Camera inspection revealed findings as noted above.  Her gallbladder had  some thickening.  We were able to grab her fundus and elevate it  anteriorly.  She had some dense adhesions of omentum to her   posterolateral wall, and these were carefully isolated and freed off.  In the dissection, there was a little tear in the anterior gallbladder  fundus with a leaking of bile but no stones.  Gradually, we were able to  elevate the gallbladder and liver more cephalad.   Given the marked inflammation and massive transverse colon swelling that  limited our view, I decided to do more of a dome-down approach.  Therefore, the peritoneal coverings between the gallbladder and the  liver bed were freed on the anteromedial and posterolateral walls  starting at the midway point of the gallbladder and heading up towards  the fundus.  With careful  dissection, I was able to free and peel the  gallbladder off it in a dome-down fashion.  There was a moderate  superficial liver vein in the gallbladder fossa of the liver, and this  was isolated and controlled with focused high-dose cautery to good  result.   Eventually, we were able to peel the gallbladder down.  I transitioned  over the left side to help isolate and free up the infundibulum.  More  careful dissection was done on the anteromedial wall to come down  carefully staying more up on the gallbladder side.  With careful  dissection, we were actually able to get a good critical view and see  the cystic artery anterior branch and posterior branch and the cystic  duct.  These were carefully isolated and skeletonized.  The branches of  the cystic artery were clipped, one clip on the gallbladder side, two  clips proximally and transected, just leaving the cystic duct.   One clip was placed on the cystic duct just at the infundibulum, and a  partial cyst ductotomy was performed.  She had a normal cystic duct.  I  was able to take a cold hook and able to probe into the cystic duct  without any difficulty.  It had about 2 cm in length on the cystic duct  and milked back and could not get any stones.  A 5-French  cholangiocatheter was placed through a  right subcostal needle puncture  site.  It was passed into the cystic duct without any difficulty.  A  clip was placed on the cystic duct at the cyst ductotomy.  The catheter  flushed well.  However, in looking and inspection, it seemed like there  was a leak.  It was not coming from the partial cyst ductotomy.  After  careful isolation and pooling, it was coming from a 1.5-mm opening at  the crotch between the cystic duct and common bile duct junction.  It  was able to peel the cystic artery further off to better expose this  area.  Using a 4-0 PDS on a small tapered needle and using laparoscopic  intracorporeal suturing by Jarit needle holders, I was able to close the  hole using a figure-of-eight stitch, taking superficial bites without  difficulty.  I was able to tie down the knot.  The catheter was able to  flush.  I actually loosened up the clip and pulled back on the catheter  a little bit and reapplied to prove the catheter was not tied into the  repairt.  The catheter flushed very well with no evidence of any leak.   A cholangiogram was run using dilute radio-opaque contrast and  continuous fluoroscopy.  Contrast flowed well from a short but  serpentine folded cystic duct.  Contrast refluxed well into the dilated  duct common hepatic duct with the right and left intrahepatic radicals.  It flowed down briskly down to the common bile duct, across a normal  ampulla into the duodenum.  Actually, contrast seem to flow more  proximally across the pylorus back into the stomach.  I could see little  reflux in the pancreatic duct as well.  I did do one more rerun to  better see the ampulla, and I could see that well.  There was no  evidence of any leak or stricture at the cystic or common bile duct.  There was no evidence of any stones or other abnormalities.  The  cholangiocatheter was removed.  Three clamps  were placed on the cystic  duct just proximal to the cyst ductotomy, and cystic  duct transection  was completed.  The cystic stump was further ligated using a 0 PDS  Endoloop to good result.  There was no evidence of any bile leak.  There  was no evidence of any bleeding.   Copious irrigation was done with about 4 liters total with good clear  return at the end.  Because there had been a bile duct injury, I went  and placed a 19-French Blake drain as noted above.  Hemostasis was  excellent at the end.  There was no evidence of any leak of bile or any  other abnormalities.  The gallbladder had already been freed off, and it  was placed in an EndoCatch bag and removed out the subxiphoid port after  opening up the fascia little bluntly and opened the skin up sharply.  The fascial defect was closed using 0 Vicryl stitch in a figure-of-eight  fashion using a laparoscopic suture passer under direct visualization to  good result.  Capnoperitoneum was evacuated.  Ports were removed.  Skin  was closed using 4-0 Monocryl stitch.  Sterile dressings were applied.  The patient extubated and taken to the recovery room in stable  condition.   The patient's family is not in the waiting room down here.  I look and  see if they are up to the patient's room.  I did discuss postoperative  care with the patient just prior to surgery and will confirm with the  family afterwards.      Ardeth Sportsman, MD  Electronically Signed     SCG/MEDQ  D:  08/14/2008  T:  08/14/2008  Job:  [id number]   cc:   Molly Maduro L. Foy [place], M.D.  Fax: [phone]   Hedwig Morton. Juanda Chance, MD  [address]  [place]  [place] [address]   Iva Boop, MD,FACG  [facility]  [address] [place], [place] [address]

## 2010-10-06 NOTE — Discharge Summary (Signed)
NAMEMARLETTA, Douglas               ACCOUNT NO.:  [id number]   MEDICAL RECORD NO.:  [id number]          PATIENT TYPE:  INP   LOCATION:  1402                         FACILITY:  [facility]   PHYSICIAN:  Ramiro Harvest, MD    DATE OF BIRTH:  [birth year]   DATE OF ADMISSION:  08/10/2008  DATE OF DISCHARGE:  08/18/2008                               DISCHARGE SUMMARY   ATTENDING PHYSICIAN:  Ramiro Harvest, MD   PRIMARY CARE PHYSICIAN:  Doris Cheadle. Foy Guadalajara, MD of [facility] physicians.   GASTROENTEROLOGIST:  Hedwig Morton. Juanda Chance, MD of [facility].   DISCHARGE DIAGNOSES:  1. Gallstone pancreatitis.  2. Acute cholecystitis.  3. Urinary tract infection status post 3 days Rocephin.  4. Hyperglycemia/probable type 2 diabetes.  5. Dehydration.  6. Hypothyroidism.  7. Hypertension.  8. Anemia.  9. Obesity.  10.History of breast cancer status post bilateral mastectomy and TRAM      flaps.  11.History of frequent urinary tract infections.  12.Status post hernia surgery.  13.Endometrial polyp.  14.Status post ventral incisional hernia.  15.Diagnosis of eczema.  16.History of benign polyps per colonoscopy per Dr. Juanda Chance.  17.Status post inguinal hernia repair in the distant past.  18.D and C and diagnostic hysteroscopy, Pap smear and resection of      endometrial tissue by Dr. Vincente Poli in November of 2005.  19.Status post laparoscopic cholecystectomy with a negative      intraoperative cholangiogram.  20.Reactive leukocytosis.   DISCHARGE MEDICATIONS:  1. Levoxyl 75 mcg p.o. q. daily.  2. Lisinopril/HCTZ 10/12.5 mg p.o. daily.  3. Clorazepate 7.5 mg half to 1 tablet p.o. q.h.s. p.r.n.  4. Claritin as needed.  5. Bactrim as needed for UTIs.  6. Glucophage 500 mg p.o. b.i.d.  7. Oxycodone 5 mg 1-2 tablets p.o. q.4 hours p.r.n.   DISPOSITION AND FOLLOWUP:  The patient will be discharged home.  The  patient is to follow up with PCP in 1 week to reassess her  hyperglycemia/probable type 2  diabetes.  The patient on followup will  need a basic metabolic profile checked and a CBC checked to follow up on  electrolytes, renal function as well as white count.  The patient will  also need to follow up with Dr. Michaell Cowing in the next 1-2 weeks for drain  removal and follow up on biopsies done during this hospitalization.  The  patient will need a CBC checked.   HOSPITAL CONSULTATIONS:  1. A GI consult was done.  The patient was seen in consultation by Dr.      Leone Payor of [facility] on August 11, 2008.  2. Surgical consultation was done.  The patient was seen in      consultation by Dr. Michaell Cowing on August 11, 2008.   PROCEDURES PERFORMED:  Acute abdominal series was performed on August 10, 2008, that showed no acute cardiopulmonary process, mild cardiac  enlargement, nonobstructive bowel gas.  Abdominal ultrasound was done on  August 10, 2008, that showed gallstones were noted within the  gallbladder, no thickening of the gallbladder wall or sonographic  Murphy's sign.  No hydronephrosis or  diagnostic renal calculus.  Chest x-  ray done August 12, 2008, showed cardiomegaly and bilateral pleural  effusions, bibasilar atelectasis, cannot exclude infiltrates at the  bases.  Intraoperative cholangiogram done on August 14, 2008, showed no  evidence of common duct stone or biliary leak.  A laparoscopic  cholecystectomy with intraoperative cholangiogram was done on August 14, 2008.  Biopsies of visceral and parietal peritoneal masses were done on  August 14, 2008, and laparoscopic repair of catheter injury at the cystic  duct/common bile duct junction was done on August 14, 2008.   ADMISSION HISTORY AND PHYSICAL:  Ms. [name] is a 65 year old  pleasant, obese female who started to have upper abdominal and low back  pain on the Thursday prior to admission which had resolved and recurred  on the Friday of admission.  The patient attributed the pain to working  in the yard.  The patient's pain  had gotten progressively worse and was  unbearable on the day of admission starting in the morning hours.  The  patient had a couple pieces of toast for breakfast with cranberry juice  which all came up.  The patient vomited about six times.  The patient  had very severe abdominal pain radiating to her back and rated at 10/10.  The patient had no blood in the emesis, no diarrhea, no chest pain, no  syncope.  The patient was just feeling fatigued.  Dilaudid was given to  the patient in the ED with antiemetics with minimal relief.  The patient  was also given IV Zosyn intraoperatively.   ADMISSION PHYSICAL EXAM:  VITAL SIGNS:  Per admitting physician  temperature of 97.2, blood pressure 108/30, pulse of 56, respirations  20, satting 92% on room air.  GENERAL:  The patient appeared in mild acute distress due to pain.  HEENT:  Normocephalic, atraumatic.  Pupils equal, round and reactive to  light.  Extraocular movements intact.  Sclerae was anicteric.  Dry  mucous membranes.  NECK:  Neck was supple with no lymphadenopathy.  LUNGS:  Lungs were clear to auscultation bilaterally.  No wheezes, no  rales, no rhonchi.  CARDIOVASCULAR:  S1-S2, no gallop, bradycardic.  ABDOMEN:  Abdomen was slightly distended, tender in the upper mid  section.  No hernias.  No masses.  No rebound symptoms.  The L-spine was  without tenderness.  LOWER EXTREMITIES:  No clubbing, cyanosis or edema.  SKIN:  Without rashes.  NEUROLOGICAL:  The patient was alert and oriented x3.  Cranial nerves II-  XII were grossly intact.  No focal deficits.  Deep tendons, muscle  strength were within normal limits.   ADMISSION LABS:  EKG showed a sinus bradycardia.  CBC with a white count  of 17.2, MCV of 93.9, platelets of 303, hemoglobin of 16.6, sodium of  141, potassium 4.6, creatinine 0.99, glucose of 143, BUN 8, AST 326, ALT  379, troponin of 0.02, lactic acid of 2.4, lipase greater than 2000.  Urinalysis with three to six  white blood cells.  KUB nonobstructive  bowel gas pattern.  Lungs were clear.  Abdominal ultrasound showed  gallstones in the gallbladder, common bile duct of 7.8 mm, fatty  infiltration of the liver.   HOSPITAL COURSE:  1. Gallstone pancreatitis.  The patient was admitted with a gallstone      pancreatitis.  The patient was placed on bowel rest, placed on IV      fluids as well as pain management with IV narcotics.  GI  was      consulted.  The patient was seen in consultation by Dr. Leone Payor on      August 11, 2008.  Acute abdominal series was done, results as stated      above.  Right upper quadrant ultrasound was also done with results      as stated above.  The patient was followed during the      hospitalization.  Lipase levels were elevated as checked and liver      enzymes were also elevated.  Surgery was consulted.  The patient      was seen in consultation by Dr. Michaell Cowing on August 11, 2008.  The      patient was monitored.  The patient improved clinically with a slow      trending down of her liver enzymes during the hospitalization.      Once the patient was stable and her acute pancreatitis had improved      significantly the patient was taken to the operating room for a      laparoscopic cholecystectomy on August 14, 2008, per Dr. Michaell Cowing.  The      patient did have a laparoscopic cholecystectomy done with negative      intraoperative cholangiogram.  Visceral and parietal peritoneal      masses were noted during surgery and biopsies were taken which will      need to be followed up as an outpatient as well.  The patient      during surgery was noted to have also an acute cholecystitis as      well.  The patient tolerated surgery well.  The patient remained      stable.  The patient was then transitioned from ice chips to clear      liquids which she tolerated and the patient's diet was advanced      slowly during the hospitalization.  The patient's lipase levels and      liver  enzymes were monitored during the hospitalization which      trended down such that by day of discharge the patient's      transaminitis had resolved as well as her lipase levels had      resolved.  The patient was tolerating p.o.'s and the patient      improved.  During the hospitalization a drain was placed which was      monitored and the patient will be discharged home on the drain with      followup with her surgeon as stated above.  The patient improved      clinically and by day of discharge the patient was in stable and      improved condition.  2. Acute cholecystitis.  See problem #1.  3. Probable UTI.  The patient was noted to have an elevated white      count during the hospitalization.  A urinalysis was obtained which      was consistent with a urinary tract infection.  Urine cultures were      obtained which were negative.  The patient was treated      presumptively with a 3 day course of IV Rocephin.  The patient      remained in stable and improved condition for discharge home in a      stable condition.  4. Hyperglycemia/probable type 2 diabetes.  During the hospitalization      the patient was noted to have elevated CBGs so hemoglobin A1c was  checked which came back at 5.8.  The patient was covered during the      hospitalization with Lantus as well as sliding scale insulin.  The      patient received diabetes teaching during the hospitalization.  The      patient will be discharged home on Glucophage 500 mg p.o. b.i.d.      The patient will also follow up with outpatient diabetes education.      The patient will follow up with PCP in 1 week for further      evaluation and management of her hyperglycemia/probable type 2      diabetes.  5. Dehydration.  The patient was noted to be dehydrated.  On admission      the patient was hydrated with IV fluids and the patient was      euvolemic by day of discharge.  6. Reactive leukocytosis.  The patient during the  hospitalization had      an elevated white count.  A urinalysis was checked which was      consistent with a UTI.  However, urine cultures came back negative.      Chest x-ray was negative for any acute infiltrates.  The patient      remained afebrile throughout the hospitalization.  The patient was      treated with a 3 day course of IV Rocephin and it was felt that the      patient's leukocytosis was likely secondary to her laparoscopic      cholecystectomy that she had.  The patient will need a followup on      her CBC as an outpatient.   The rest of the patient's chronic medical issues remained stable  throughout the hospitalization and the patient will be discharged in  stable and improved condition.   </DISCHARGE vitals and labs>  VITAL SIGNS:  On day of discharge vital signs temperature 97.8, pulse  66, blood pressure 168/77, respiratory rate 16, satting 97% on room air.   DISCHARGE LABS:  Lipase 21, sodium 139, potassium 3.6, chloride 105,  bicarb 29, BUN 13, creatinine 0.95, glucose of 307, calcium of 7.5,  bilirubin of 1.1, alk phosphatase 95, AST 22, ALT 32, protein of 4.7,  albumin of 2.0.  Discharge CBC - white count 17.3, hemoglobin 11.5 and  platelets of 228, hematocrit of 34.4.   It was a pleasure taking care of Ms. Rose Fillers.      Ramiro Harvest, MD  Electronically Signed     DT/MEDQ  D:  08/18/2008  T:  08/18/2008  Job:  [id number]   cc:   Ardeth Sportsman, MD  [address] [place] [address]   Hedwig Morton. Juanda Chance, MD  [address]  [place]  [place] [address]   Doris Cheadle. Foy Guadalajara, M.D.  Fax: [phone]

## 2010-10-06 NOTE — Consult Note (Signed)
NAMESUNG, [name]               ACCOUNT NO.:  [id number]   MEDICAL RECORD NO.:  [id number]          PATIENT TYPE:  INP   LOCATION:  1226                         [facility]ACILITY[facility]  [facility]   PHYSICIAN:  Ardeth Sportsman, MD     DATE OF BIRTH:  1945-08-31   DATE OF CONSULTATION:  08/11/2008  DATE OF DISCHARGE:                                 CONSULTATION   PRIMARY CARE PHYSICIAN:  Robert L. Foy Guadalajara, M.D.   GASTROENTEROLOGIST:  Hedwig Morton. Juanda Chance, MD.   REQUESTING PHYSICIAN:  Iva Boop, MD,FACG, [facility].   REASON FOR CONSULTATION:  Gallstone pancreatitis.   HISTORY OF PRESENT ILLNESS:  [name] is a 65 year old obese female  who had about a 4-day history of abdominal pain prior to admission.  She  noted that pain was mainly in the epigastric region but radiated to her  lower abdomen and her back as well.  She tried both Tums and Rolaids,  but did not feel that it helped enough.  She tried Tylenol and it only  helped a little.  She felt nauseated.  She actually had emesis.  The  pain intensified.  She started getting some sweats.  She came in the  emergency room and was admitted yesterday.  Workup was concerning for  gallstones.  Her history of elevated lipase and liver function tests  concerning for gallstone pancreatitis.  GI consult was made and they  agree with this diagnosis, and therefore they requested surgical  consultation for probable need for imminent cholecystectomy.   The patient rarely drinks any alcohol.  She has no history of prior  pancreatitis.  She normally can tolerate food pretty well with no  dysphagia.  Denies any really significant history of heartburn or  reflux.  She can walk a mile or two without much difficulty and denies  any exertional chest pain.  No history of cardiac or pulmonary issues.   PAST MEDICAL HISTORY:  1. Hypertension.  2. Hypothyroidism.  3. Breast cancer status post bilateral mastectomy with trans flap in      the late  1980s.  4. Endometrial polyp.  5. Morbid obesity.  6. Ventral incisional hernia.  7. She has a diagnosis of eczema.  8. She had a colonoscopy with Dr. Lina Sar and was told she had      benign polyps a few years ago.  9. In 2002 she had hyperplastic polyp based on a colonoscopy by Dr.      Lina Sar.  10.Probable diabetic.   PAST SURGICAL HISTORY:  1. She had an inguinal hernia repair in the distant past.  2. She has also had a ventral incisional hernia repair done by Dr.      Carman Ching laparoscopically.  3. Dilation and curettage, diagnostic hysteroscopy, Pap smear and      resection of endometrial tissue by Dr. Vincente Poli in November of 2005.   MEDICATIONS AT HOME:  Include:  1. Levoxyl.  2. Lisinopril/hydrochlorothiazide.  3. Clorazepate p.r.n.  4. Trimethoprim p.r.n.  5. Claritin p.r.n.  6. Porotic ointment as needed for eczema.   ALLERGIES:  She self reports that she has poor tolerance to most  narcotics.  She is currently on morphine and seems to be tolerating that  relatively well.   SOCIAL HISTORY:  She rarely drinks any alcohol.  She is married in a  stable relationship.  Her husband is actually at the bedside.  Her  daughter and grandson are here as well.  No tobacco use.  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   FAMILY HISTORY:  Mom had breast cancer at age 60.  She said diabetes  runs in the mom's side of the family.  She had a sister who had colon  cancer.   REVIEW OF SYSTEMS:  GENERAL:  She did have sweats when she came in at  admission, but seems to be better.  No fevers or chills.  No weight gain  or weight loss.  EYES, ENT, NEUROLOGIC, PSYCHIATRIC:  All negative.  SKIN:  Some eczema but well controlled topical medication.  No recent  flares.  PULMONARY/CARDIOVASCULAR:  Negative.  GI:  As noted above.  No  hematochezia or melena.  No hematemesis.  GU:  No dysuria or pyuria at  this time.  The patient does not recall any history of kidney  stones.  HEME/LYMPH:  Negative.  ALLERGIC:  As noted above with poor tolerance,  but I do into know if she has true drug allergies.  Poor tolerance to  narcotics but no true allergies.   PHYSICAL EXAMINATION:  VITAL SIGNS:  Pulse 74, respirations 16, blood  pressure most recently was 152/56.  Temperature 98.7.  Her glucose was  312.  On physical exam she is a well-developed morbidly obese female lying in  bed and comfortable, not frankly toxic.  PSYCH:  She is interactive with at least average intelligence.  No  evidence of any dementia, delirium, psychosis or paranoia.  EYES:  Pupils equal, round, reactive to light.  Extraocular movements  are intact.  Her sclerae are a little bit ashen and jaundiced, but no  definite scleral injection.  NECK:  Supple, no masses.  Trachea is midline.  ENT:  Normocephalic.  Mucous membranes are dry.  Nasopharynx and  oropharynx are clear.  CHEST:  Clear to auscultation bilaterally.  No wheezes, rales, rhonchi.  No pain or rub on sternal compression.  HEART:  Regular rate and rhythm.  No murmurs, clicks, rubs.  BREASTS:  She is statu post bilateral trans flaps and incisions healed.  No obvious ulcers or nipple discharge.  ABDOMEN:  Obese, but soft.  She does have some discomfort in the  epigastric region grater than in the lower abdomen.  She has some  discomfort to cough but no definite peritoneal signs.  GENITAL:  Normal external female genitalia.  No inguinal hernias.  RECTAL:  Deferred.  EXTREMITIES:  No edema.  LYMPH:  No head, neck, axillary, groin lymphadenopathy.  EXTREMITIES:  No clubbing, cyanosis or edema.  SKIN:  No obvious petechiae, purpura.  No major sores or lesions.   LABORATORY VALUES:  Her white count is 13.6, down from 17.2 yesterday.  Her hemoglobin is 15.4.  Potassium is 545.  Glucose is 279.  It was 143  on admission.  Her lipase was greater than 2000 on admission, now at  1368.  Her liver function tests are all moderately  elevated.  Her total  bilirubin was 5.1 now down to 4.3.  Studies show a 3-way film that is pretty nonspecific without evidence of  pneumonia or bowel  obstruction.  She has an ultrasound which shows gallstones but no gallbladder wall  thickening.  Her common bile duct is 7.8, which is upper limits of  normal.   ASSESSMENT/PLAN:  [age]-year-old morbidly obese female with  elevated lipase and liver function tests and epigastric abdominal pain  with known gallstones.  This is most consistent with gallstone  pancreatitis.  1. Continue nothing by mouth.  2. Intravenous fluids.  3. Followup laboratory values.  4. Laparoscopic cholecystectomy with inoperative cholangiogram soon.  The anatomy and physiology of hepatobiliary and pancreatic function were  discussed.  The pathophysiology of cholecystolithiasis with its risks  was discussed.  Options were discussed and recommendation was made for  laparoscopic cholecystectomy with intraoperative cholangiogram.  Risks,  benefits and alternatives were discussed.  Questions were answered and  she agreed to proceed.  I think at this point because her liver function studies are improving,  her lipase is improving, we probably could do a laparoscopic  cholecystectomy first.  If she deteriorates clinically, then I would an  endoscopic retrograde cholangiopancreatography done first.  I have  discussed this with Dr. Leone Payor, who agreed with my plan and relayed  this to the family as well.  It would be best to wait at least a day or  two until her pain goes down and her lipase is more in the normal range  before attempting cholecystectomy.  As there is no evidence of any  cholecystitis, I do not think we are rushed necessarily.  However, I  worry about waiting too long less she get another attack.  1. Elevated glucose.  It seems a little surprising, but I will defer      to Medicine's management on that and see if sliding scale insulin      is  necessary.  2. Hypertension, controlled.  3. Consider switching off of morphine to a different type of narcotic      that causes less ampullary spasming.  However, the numbers are      improving so perhaps just close watching will help given the fact      that she has poor tolerance to other narcotics.      Ardeth Sportsman, MD  Electronically Signed     SCG/MEDQ  D:  08/11/2008  T:  08/11/2008  Job:  [id number]

## 2010-10-09 NOTE — Op Note (Signed)
[facility]  Patient:    [name] Visit Number: [id number] MRN: [id number]          Service Type: Attending:  Abigail Miyamoto, M.D. Dictated by:   Abigail Miyamoto, M.D. Proc. Date: 07/11/01                             Operative Report  PREOPERATIVE DIAGNOSIS:  Ventral incisional hernia.  POSTOPERATIVE DIAGNOSIS:  Ventral incisional hernia.  PROCEDURE:  Laparoscopic ventral incisional hernia repair with mesh.  SURGEON:  Abigail Miyamoto, M.D.  ASSISTANT:  Sheppard Plumber. Earlene Plater, M.D.  ANESTHESIA:  General endotracheal.  MESH:  18 x 18 cm polyester mesh.  PROCEDURE IN DETAIL:  The patient was brought to the operating room, identified as [name]. She is placed supine on the operating room table and general anesthesia is induced. Her abdomen was then prepped and draped in the usual sterile fashion. Using a #15 blade, a small transverse incision is made in the patients left upper quadrant. The incision was carried down through the fascial and muscle layers with the electrocautery. The peritoneum was the identified and opened. A 0 Vicryl pursestring suture was then placed around the fascial opening. The Hasson port was placed in the opening and insufflation of the abdomen was begun. The patient was found to have a large ventral hernia defect in the lower abdomen that was approximately 14 x 14 cm in dimension. No adhesions were stuck to the hernia itself. One small adhesion to the abdominal wall was taken down bluntly. A good measure of the fascial defect was identified and then a piece of polyester mesh is brought onto the field and fashioned into an 18 x 18 cm portion. Four 0 Novafil popoff sutures were placed in four corners of the mesh. The mesh was then molded up and placed into the abdominal cavity. The mesh was then unrolled under direct vision. Prior to this, two 5 mm ports were placed, one in the left lower quadrant and one in the  right upper quadrant, under direct vision. The mesh was then unrolled. Four tiny incisions were made in the abdominal wall with an #11 blade scalpel in an area approximately 3 to 4 cm lateral to each of the fascial edges. The suture graspers were then used to grab each of the pieces of Novafil suture and pull up through the abdominal wall. All four sutures were then pulled up, pulling the mesh up to the abdominal wall. All four pieces of suture were then tied in place. The mesh was then tacked in circumferentially with the surgical tacker. Again, at least 3 cm of extra mesh covered each side of the hernia defect, giving excellent coverage of the hernia. The mesh laid very well. At this point, both 5 mm ports were removed under direct vision and the abdomen was deflated. The mesh again was seen to lay flat and appropriate as the abdominal cavity was relieved of the carbon dioxide. At this point, the Hasson port was removed and the 0 Vicryl was tied in placed closing the fascial defect. Two separate 0 Novafil sutures were also placed in the fascia. All incisions were then anesthetized with 0.25% Marcaine and closed with 4-0 Monocryl subcuticular sutures. Steri-Strips, gauze, and tape were then applied. The patient tolerated the procedure well. All sponge, needle, and instrument counts were correct at the end of the procedure. The patient was then extubated in the operating room and taken  in stable condition to the recovery. Dictated by:   Abigail Miyamoto, M.D. Attending:  Abigail Miyamoto, M.D. DD:  07/11/01 TD:  07/11/01 Job: [id number]

## 2010-10-09 NOTE — Op Note (Signed)
[name], [name]               ACCOUNT NO.:  [id number]   MEDICAL RECORD NO.:  [id number]          PATIENT TYPE:  AMB   LOCATION:  SDC                           FACILITY:  [facility]   PHYSICIAN:  Michelle L. Grewal, M.D.DATE OF BIRTH:  04/11/1946   DATE OF PROCEDURE:  03/29/2004  DATE OF DISCHARGE:  03/20/2004                                 OPERATIVE REPORT   PREOPERATIVE DIAGNOSES:  1.  Postmenopausal bleeding.  2.  Endometrial polyp.   POSTOPERATIVE DIAGNOSES:  1.  Postmenopausal bleeding.  2.  Endometrial polyp.   PROCEDURE:  1.  Dilatation and curettage.  2.  Diagnostic hysteroscopy.  3.  Pap smear.  4.  Resection of endometrial tissue.   SURGEON:  Dr. Vincente Poli   ANESTHESIA:  MAC with paracervical.   ESTIMATED BLOOD LOSS:  Minimal.   DESCRIPTION OF PROCEDURE:  The patient was taken to the operating room.  She  was given anesthesia and placed in the lithotomy position.  She was prepped  and draped in the usual sterile fashion.  An in and out catheter was used to  empty the bladder.  A speculum was inserted into the vagina.  The cervix was  grasped with a tenaculum, and a paracervical block was performed in the  standard fashion at 5 and 7 o'clock.  The cervical internal os was gently  dilated.  A Pap smear was performed, and a diagnostic hysteroscope was  inserted into the uterine cavity and with good visualization and good  distention, there was noted to be some polypoid areas emanating from the  posterior wall of the uterus.  The hysteroscope was removed, and a sharp  curette was inserted, and a sharp curettage was performed of all four walls  of the uterus.  The hysteroscope was reinserted, and the uterine cavity was  noted to be cleaned of all tissue.  All instruments were removed from the  vagina.  All tissue was removed and sent to pathology as well as the  ThinPrep Pap that was performed.  All sponge, lap, and instrument counts  were correct x 2, and the patient  went to recovery room in stable condition.     [place]   MLG/MEDQ  D:  03/29/2004  T:  03/29/2004  Job:  [id number]

## 2011-03-31 ENCOUNTER — Encounter: Payer: Self-pay | Admitting: Gastroenterology

## 2011-03-31 ENCOUNTER — Encounter: Payer: Self-pay | Admitting: Internal Medicine

## 2011-05-13 ENCOUNTER — Ambulatory Visit: Payer: Self-pay | Admitting: Internal Medicine

## 2011-10-08 ENCOUNTER — Other Ambulatory Visit: Payer: Self-pay | Admitting: Obstetrics and Gynecology

## 2020-06-24 DIAGNOSIS — R0789 Other chest pain: Secondary | ICD-10-CM | POA: Diagnosis not present

## 2020-06-24 DIAGNOSIS — R0602 Shortness of breath: Secondary | ICD-10-CM | POA: Diagnosis not present

## 2020-06-24 DIAGNOSIS — J40 Bronchitis, not specified as acute or chronic: Secondary | ICD-10-CM | POA: Diagnosis not present

## 2020-06-24 DIAGNOSIS — R062 Wheezing: Secondary | ICD-10-CM | POA: Diagnosis not present

## 2020-06-24 DIAGNOSIS — J9801 Acute bronchospasm: Secondary | ICD-10-CM | POA: Diagnosis not present

## 2020-07-28 ENCOUNTER — Other Ambulatory Visit: Payer: Self-pay

## 2020-07-28 ENCOUNTER — Inpatient Hospital Stay (HOSPITAL_COMMUNITY)
Admission: EM | Admit: 2020-07-28 | Discharge: 2020-08-04 | DRG: 956 | Disposition: A | Discharge status: Skilled Nursing Facility | Payer: Medicare Other | Attending: Internal Medicine | Admitting: Internal Medicine

## 2020-07-28 ENCOUNTER — Emergency Department (HOSPITAL_COMMUNITY): Payer: Medicare Other

## 2020-07-28 ENCOUNTER — Inpatient Hospital Stay (HOSPITAL_COMMUNITY): Payer: Medicare Other

## 2020-07-28 ENCOUNTER — Encounter (HOSPITAL_COMMUNITY): Payer: Self-pay | Admitting: Family Medicine

## 2020-07-28 DIAGNOSIS — Z96649 Presence of unspecified artificial hip joint: Secondary | ICD-10-CM

## 2020-07-28 DIAGNOSIS — I10 Essential (primary) hypertension: Secondary | ICD-10-CM | POA: Diagnosis not present

## 2020-07-28 DIAGNOSIS — Z7984 Long term (current) use of oral hypoglycemic drugs: Secondary | ICD-10-CM | POA: Diagnosis not present

## 2020-07-28 DIAGNOSIS — W010XXA Fall on same level from slipping, tripping and stumbling without subsequent striking against object, initial encounter: Secondary | ICD-10-CM | POA: Diagnosis present

## 2020-07-28 DIAGNOSIS — N289 Disorder of kidney and ureter, unspecified: Secondary | ICD-10-CM

## 2020-07-28 DIAGNOSIS — M2550 Pain in unspecified joint: Secondary | ICD-10-CM | POA: Diagnosis not present

## 2020-07-28 DIAGNOSIS — Z743 Need for continuous supervision: Secondary | ICD-10-CM | POA: Diagnosis not present

## 2020-07-28 DIAGNOSIS — Z9010 Acquired absence of unspecified breast and nipple: Secondary | ICD-10-CM | POA: Diagnosis not present

## 2020-07-28 DIAGNOSIS — D649 Anemia, unspecified: Secondary | ICD-10-CM

## 2020-07-28 DIAGNOSIS — Z20822 Contact with and (suspected) exposure to covid-19: Secondary | ICD-10-CM | POA: Diagnosis present

## 2020-07-28 DIAGNOSIS — K5900 Constipation, unspecified: Secondary | ICD-10-CM | POA: Diagnosis not present

## 2020-07-28 DIAGNOSIS — D539 Nutritional anemia, unspecified: Secondary | ICD-10-CM | POA: Diagnosis present

## 2020-07-28 DIAGNOSIS — Z853 Personal history of malignant neoplasm of breast: Secondary | ICD-10-CM | POA: Diagnosis not present

## 2020-07-28 DIAGNOSIS — Z7401 Bed confinement status: Secondary | ICD-10-CM | POA: Diagnosis not present

## 2020-07-28 DIAGNOSIS — R079 Chest pain, unspecified: Secondary | ICD-10-CM

## 2020-07-28 DIAGNOSIS — S72032A Displaced midcervical fracture of left femur, initial encounter for closed fracture: Principal | ICD-10-CM | POA: Diagnosis present

## 2020-07-28 DIAGNOSIS — E11649 Type 2 diabetes mellitus with hypoglycemia without coma: Secondary | ICD-10-CM | POA: Diagnosis not present

## 2020-07-28 DIAGNOSIS — R52 Pain, unspecified: Secondary | ICD-10-CM

## 2020-07-28 DIAGNOSIS — Z803 Family history of malignant neoplasm of breast: Secondary | ICD-10-CM

## 2020-07-28 DIAGNOSIS — S72002A Fracture of unspecified part of neck of left femur, initial encounter for closed fracture: Secondary | ICD-10-CM

## 2020-07-28 DIAGNOSIS — Z9889 Other specified postprocedural states: Secondary | ICD-10-CM | POA: Diagnosis not present

## 2020-07-28 DIAGNOSIS — E875 Hyperkalemia: Secondary | ICD-10-CM

## 2020-07-28 DIAGNOSIS — S1093XA Contusion of unspecified part of neck, initial encounter: Secondary | ICD-10-CM | POA: Diagnosis not present

## 2020-07-28 DIAGNOSIS — E1165 Type 2 diabetes mellitus with hyperglycemia: Secondary | ICD-10-CM | POA: Diagnosis not present

## 2020-07-28 DIAGNOSIS — I517 Cardiomegaly: Secondary | ICD-10-CM | POA: Diagnosis not present

## 2020-07-28 DIAGNOSIS — R17 Unspecified jaundice: Secondary | ICD-10-CM | POA: Diagnosis present

## 2020-07-28 DIAGNOSIS — Z96642 Presence of left artificial hip joint: Secondary | ICD-10-CM | POA: Diagnosis not present

## 2020-07-28 DIAGNOSIS — R109 Unspecified abdominal pain: Secondary | ICD-10-CM | POA: Diagnosis not present

## 2020-07-28 DIAGNOSIS — Z471 Aftercare following joint replacement surgery: Secondary | ICD-10-CM | POA: Diagnosis not present

## 2020-07-28 DIAGNOSIS — E872 Acidosis, unspecified: Secondary | ICD-10-CM | POA: Diagnosis present

## 2020-07-28 DIAGNOSIS — R6889 Other general symptoms and signs: Secondary | ICD-10-CM | POA: Diagnosis not present

## 2020-07-28 DIAGNOSIS — R5381 Other malaise: Secondary | ICD-10-CM | POA: Diagnosis not present

## 2020-07-28 DIAGNOSIS — E119 Type 2 diabetes mellitus without complications: Secondary | ICD-10-CM | POA: Diagnosis not present

## 2020-07-28 DIAGNOSIS — M7989 Other specified soft tissue disorders: Secondary | ICD-10-CM | POA: Diagnosis not present

## 2020-07-28 DIAGNOSIS — Z882 Allergy status to sulfonamides status: Secondary | ICD-10-CM | POA: Diagnosis not present

## 2020-07-28 DIAGNOSIS — T07XXXA Unspecified multiple injuries, initial encounter: Secondary | ICD-10-CM

## 2020-07-28 DIAGNOSIS — R0789 Other chest pain: Secondary | ICD-10-CM | POA: Diagnosis not present

## 2020-07-28 DIAGNOSIS — Y92008 Other place in unspecified non-institutional (private) residence as the place of occurrence of the external cause: Secondary | ICD-10-CM | POA: Diagnosis not present

## 2020-07-28 DIAGNOSIS — E039 Hypothyroidism, unspecified: Secondary | ICD-10-CM | POA: Diagnosis not present

## 2020-07-28 DIAGNOSIS — T796XXA Traumatic ischemia of muscle, initial encounter: Secondary | ICD-10-CM | POA: Diagnosis present

## 2020-07-28 DIAGNOSIS — M1612 Unilateral primary osteoarthritis, left hip: Secondary | ICD-10-CM | POA: Diagnosis not present

## 2020-07-28 DIAGNOSIS — Z794 Long term (current) use of insulin: Secondary | ICD-10-CM

## 2020-07-28 DIAGNOSIS — Z419 Encounter for procedure for purposes other than remedying health state, unspecified: Secondary | ICD-10-CM

## 2020-07-28 DIAGNOSIS — W19XXXA Unspecified fall, initial encounter: Secondary | ICD-10-CM

## 2020-07-28 DIAGNOSIS — E861 Hypovolemia: Secondary | ICD-10-CM | POA: Diagnosis present

## 2020-07-28 DIAGNOSIS — E785 Hyperlipidemia, unspecified: Secondary | ICD-10-CM | POA: Diagnosis not present

## 2020-07-28 DIAGNOSIS — M1712 Unilateral primary osteoarthritis, left knee: Secondary | ICD-10-CM | POA: Diagnosis not present

## 2020-07-28 HISTORY — DX: Hypothyroidism, unspecified: E03.9

## 2020-07-28 HISTORY — DX: Essential (primary) hypertension: I10

## 2020-07-28 HISTORY — DX: Long term (current) use of insulin: E11.9

## 2020-07-28 HISTORY — DX: Malignant (primary) neoplasm, unspecified: C80.1

## 2020-07-28 LAB — URINALYSIS, COMPLETE (UACMP) WITH MICROSCOPIC
Bacteria, UA: NONE SEEN
Bilirubin Urine: NEGATIVE
Glucose, UA: 50 mg/dL — AB
Hgb urine dipstick: NEGATIVE
Ketones, ur: 80 mg/dL — AB
Nitrite: NEGATIVE
Protein, ur: NEGATIVE mg/dL
Specific Gravity, Urine: 1.02 (ref 1.005–1.030)
pH: 5 (ref 5.0–8.0)

## 2020-07-28 LAB — CBC WITH DIFFERENTIAL/PLATELET
Abs Immature Granulocytes: 0.03 10*3/uL (ref 0.00–0.07)
Basophils Absolute: 0.1 10*3/uL (ref 0.0–0.1)
Basophils Relative: 1 %
Eosinophils Absolute: 0.1 10*3/uL (ref 0.0–0.5)
Eosinophils Relative: 1 %
HCT: 30.4 % — ABNORMAL LOW (ref 36.0–46.0)
Hemoglobin: 10 g/dL — ABNORMAL LOW (ref 12.0–15.0)
Immature Granulocytes: 0 %
Lymphocytes Relative: 10 %
Lymphs Abs: 0.8 10*3/uL (ref 0.7–4.0)
MCH: 30.5 pg (ref 26.0–34.0)
MCHC: 32.9 g/dL (ref 30.0–36.0)
MCV: 92.7 fL (ref 80.0–100.0)
Monocytes Absolute: 0.7 10*3/uL (ref 0.1–1.0)
Monocytes Relative: 9 %
Neutro Abs: 6.5 10*3/uL (ref 1.7–7.7)
Neutrophils Relative %: 79 %
Platelets: 168 10*3/uL (ref 150–400)
RBC: 3.28 MIL/uL — ABNORMAL LOW (ref 3.87–5.11)
RDW: 12.9 % (ref 11.5–15.5)
WBC: 8.1 10*3/uL (ref 4.0–10.5)
nRBC: 0 % (ref 0.0–0.2)

## 2020-07-28 LAB — COMPREHENSIVE METABOLIC PANEL
ALT: 15 U/L (ref 0–44)
AST: 51 U/L — ABNORMAL HIGH (ref 15–41)
Albumin: 3.6 g/dL (ref 3.5–5.0)
Alkaline Phosphatase: 76 U/L (ref 38–126)
Anion gap: 16 — ABNORMAL HIGH (ref 5–15)
BUN: 17 mg/dL (ref 8–23)
CO2: 15 mmol/L — ABNORMAL LOW (ref 22–32)
Calcium: 8.7 mg/dL — ABNORMAL LOW (ref 8.9–10.3)
Chloride: 102 mmol/L (ref 98–111)
Creatinine, Ser: 1.24 mg/dL — ABNORMAL HIGH (ref 0.44–1.00)
GFR, Estimated: 45 mL/min — ABNORMAL LOW (ref >60)
Glucose, Bld: 265 mg/dL — ABNORMAL HIGH (ref 70–99)
Potassium: 6.3 mmol/L (ref 3.5–5.1)
Sodium: 133 mmol/L — ABNORMAL LOW (ref 135–145)
Total Bilirubin: 2.7 mg/dL — ABNORMAL HIGH (ref 0.3–1.2)
Total Protein: 6.8 g/dL (ref 6.5–8.1)

## 2020-07-28 LAB — RESP PANEL BY RT-PCR (FLU A&B, COVID) ARPGX2
Influenza A by PCR: NEGATIVE
Influenza B by PCR: NEGATIVE
SARS Coronavirus 2 by RT PCR: NEGATIVE

## 2020-07-28 LAB — POTASSIUM: Potassium: 4.3 mmol/L (ref 3.5–5.1)

## 2020-07-28 LAB — CK: Total CK: 301 U/L — ABNORMAL HIGH (ref 38–234)

## 2020-07-28 MED ORDER — ALBUTEROL SULFATE HFA 108 (90 BASE) MCG/ACT IN AERS
2.0000 | INHALATION_SPRAY | Freq: Four times a day (QID) | RESPIRATORY_TRACT | Status: DC | PRN
  Filled 2020-07-28: qty 6.7

## 2020-07-28 MED ORDER — LABETALOL HCL 5 MG/ML IV SOLN: 10.0000 mg | INTRAVENOUS | Status: DC | PRN

## 2020-07-28 MED ORDER — CLONAZEPAM 0.25 MG PO TBDP: 0.2500 mg | ORAL_TABLET | Freq: Two times a day (BID) | ORAL | Status: DC | PRN

## 2020-07-28 MED ORDER — LEVOTHYROXINE SODIUM 100 MCG PO TABS: 100.0000 ug | ORAL_TABLET | ORAL | Status: DC

## 2020-07-28 MED ORDER — SODIUM CHLORIDE 0.9 % IV BOLUS
500.0000 mL | Freq: Once | INTRAVENOUS | Status: AC
  Administered 2020-07-28: 500 mL via INTRAVENOUS

## 2020-07-28 MED ORDER — LEVOTHYROXINE SODIUM 100 MCG PO TABS
100.0000 ug | ORAL_TABLET | ORAL | Status: DC
  Administered 2020-07-30 – 2020-08-04 (×4): 100 ug via ORAL
  Filled 2020-07-28 (×4): qty 1

## 2020-07-28 MED ORDER — ONDANSETRON HCL 4 MG/2ML IJ SOLN: 4.0000 mg | Freq: Four times a day (QID) | INTRAMUSCULAR | Status: DC | PRN

## 2020-07-28 MED ORDER — CEFAZOLIN SODIUM-DEXTROSE 2-4 GM/100ML-% IV SOLN: 2.0000 g | INTRAVENOUS | Status: AC

## 2020-07-28 MED ORDER — LACTATED RINGERS IV SOLN: INTRAVENOUS | Status: DC

## 2020-07-28 MED ORDER — ATORVASTATIN CALCIUM 10 MG PO TABS
20.0000 mg | ORAL_TABLET | Freq: Every day | ORAL | Status: DC
  Administered 2020-07-31 – 2020-08-04 (×5): 20 mg via ORAL
  Filled 2020-07-28 (×5): qty 2

## 2020-07-28 MED ORDER — LEVOTHYROXINE SODIUM 100 MCG PO TABS: 100.0000 ug | ORAL_TABLET | Freq: Every day | ORAL | Status: DC

## 2020-07-28 MED ORDER — STERILE WATER FOR INJECTION IV SOLN
INTRAVENOUS | Status: AC
  Filled 2020-07-28: qty 850

## 2020-07-28 MED ORDER — INSULIN ASPART 100 UNIT/ML ~~LOC~~ SOLN
0.0000 [IU] | SUBCUTANEOUS | Status: DC
  Administered 2020-07-29: 7 [IU] via SUBCUTANEOUS
  Administered 2020-07-29: 5 [IU] via SUBCUTANEOUS
  Administered 2020-07-29: 1 [IU] via SUBCUTANEOUS
  Administered 2020-07-29: 2 [IU] via SUBCUTANEOUS
  Administered 2020-07-29 – 2020-07-30 (×2): 1 [IU] via SUBCUTANEOUS
  Administered 2020-07-30: 2 [IU] via SUBCUTANEOUS
  Administered 2020-07-31: 5 [IU] via SUBCUTANEOUS

## 2020-07-28 MED ORDER — MORPHINE SULFATE (PF) 2 MG/ML IV SOLN
1.0000 mg | INTRAVENOUS | Status: DC | PRN
  Administered 2020-07-28 – 2020-07-30 (×5): 2 mg via INTRAVENOUS
  Administered 2020-07-30: 1 mg via INTRAVENOUS
  Administered 2020-07-30 – 2020-07-31 (×4): 2 mg via INTRAVENOUS
  Filled 2020-07-28 (×10): qty 1

## 2020-07-28 MED ORDER — HYDROCODONE-ACETAMINOPHEN 5-325 MG PO TABS
1.0000 | ORAL_TABLET | Freq: Once | ORAL | Status: AC
  Administered 2020-07-28: 1 via ORAL
  Filled 2020-07-28: qty 1

## 2020-07-28 MED ORDER — MECLIZINE HCL 25 MG PO TABS: 25.0000 mg | ORAL_TABLET | Freq: Every day | ORAL | Status: DC | PRN

## 2020-07-28 MED ORDER — SENNOSIDES-DOCUSATE SODIUM 8.6-50 MG PO TABS: 1.0000 | ORAL_TABLET | Freq: Every evening | ORAL | Status: DC | PRN

## 2020-07-28 MED ORDER — SODIUM POLYSTYRENE SULFONATE 15 GM/60ML PO SUSP
15.0000 g | Freq: Once | ORAL | Status: AC
  Administered 2020-07-28: 15 g via ORAL
  Filled 2020-07-28: qty 60

## 2020-07-28 NOTE — ED Provider Notes (Signed)
[facility] EMERGENCY DEPARTMENT Provider Note   CSN: [id number] Arrival date & time: 07/28/20  1416     History Chief Complaint  Patient presents with  . Fall    [name] is a 75 y.o. female.  HPI She states that she fell, 3 days ago, when she slipped, while working on her pool.  After that she was unable stand up and walk but was able to get herself into her house by scooting on her bottom, and up the steps.  Family members helped her to her bed.  EMS was summoned to the home but patient was not transported at that time.  She presents today for worsening pain in the left hip region.  She also complains of pain in her left ribs, and her neck where she was injured when she fell.  She denies headache, back pain, focal weakness or paresthesia.  There are no other known active modifying factors.    Past Medical History:  Diagnosis Date  . Cancer [facility])    prior breast cancer 25 years ago   . Diabetes mellitus, type II, insulin dependent ([place]) 07/28/2020  . Essential hypertension 07/28/2020  . Hypothyroidism, unspecified 07/28/2020    Patient Active Problem List   Diagnosis Date Noted  . Closed left hip fracture, initial encounter ([place]) 07/28/2020  . Mild renal insufficiency 07/28/2020  . Hyperkalemia 07/28/2020  . Metabolic acidosis 92/03/9416  . Diabetes mellitus, type II, insulin dependent ([place]) 07/28/2020  . Essential hypertension 07/28/2020  . Hypothyroidism, unspecified 07/28/2020  . History of breast cancer 07/28/2020    Past Surgical History:  Procedure Laterality Date  . CHOLECYSTECTOMY    . HERNIA REPAIR    . MASTECTOMY       OB History   No obstetric history on file.     Family History  Problem Relation Age of Onset  . Breast cancer Mother     Social History   Tobacco Use  . Smoking status: Never Smoker  . Smokeless tobacco: Never Used  Substance Use Topics  . Alcohol use: Not Currently    Comment: 1-2 drinks per year  . Drug  use: Never    Home Medications Prior to Admission medications   Not on File    Allergies    Sulfa antibiotics  Review of Systems   Review of Systems  All other systems reviewed and are negative.   Physical Exam Updated Vital Signs BP (!) 157/142   Pulse 84   Temp 98.2 F (36.8 C) (Oral)   Resp 19   SpO2 100%   Physical Exam Vitals and nursing note reviewed.  Constitutional:      General: She is not in acute distress.    Appearance: She is well-developed and well-nourished. She is not ill-appearing, toxic-appearing or diaphoretic.  HENT:     Head: Normocephalic and atraumatic.     Right Ear: External ear normal.     Left Ear: External ear normal.  Eyes:     Extraocular Movements: EOM normal.     Conjunctiva/sclera: Conjunctivae normal.     Pupils: Pupils are equal, round, and reactive to light.  Neck:     Trachea: Phonation normal.  Cardiovascular:     Rate and Rhythm: Normal rate and regular rhythm.     Heart sounds: Normal heart sounds.  Pulmonary:     Effort: Pulmonary effort is normal.     Breath sounds: Normal breath sounds.  Chest:     Chest wall:  Tenderness (Left, diffuse without crepitation) present. No bony tenderness.  Abdominal:     Palpations: Abdomen is soft.     Tenderness: There is no abdominal tenderness.  Musculoskeletal:        General: Normal range of motion.     Cervical back: Normal range of motion and neck supple. No rigidity or tenderness.     Comments: Tender left hip without deformity.  Left leg externally rotated and shortened.  Normal range of motion arms and right leg  Skin:    General: Skin is warm, dry and intact.     Comments: Small bruise left anterior neck  Neurological:     Mental Status: She is alert and oriented to person, place, and time.     Cranial Nerves: No cranial nerve deficit.     Sensory: No sensory deficit.     Motor: No abnormal muscle tone.     Coordination: Coordination normal.  Psychiatric:        Mood  and Affect: Mood and affect and mood normal.        Behavior: Behavior normal.        Thought Content: Thought content normal.        Judgment: Judgment normal.     ED Results / Procedures / Treatments   Labs (all labs ordered are listed, but only abnormal results are displayed) Labs Reviewed  COMPREHENSIVE METABOLIC PANEL - Abnormal; Notable for the following components:      Result Value   Sodium 133 (*)    Potassium 6.3 (*)    CO2 15 (*)    Glucose, Bld 265 (*)    Creatinine, Ser 1.24 (*)    Calcium 8.7 (*)    AST 51 (*)    Total Bilirubin 2.7 (*)    GFR, Estimated 45 (*)    Anion gap 16 (*)    All other components within normal limits  RESP PANEL BY RT-PCR (FLU A&B, COVID) ARPGX2  CBC WITH DIFFERENTIAL/PLATELET  CBC WITH DIFFERENTIAL/PLATELET  POTASSIUM  POTASSIUM  POTASSIUM  CBC  VITAMIN D 25 HYDROXY (VIT D DEFICIENCY, FRACTURES)  COMPREHENSIVE METABOLIC PANEL    EKG EKG Interpretation  Date/Time:  Monday July 28 2020 22:46:06 EST Ventricular Rate:  83 PR Interval:    QRS Duration: 85 QT Interval:  391 QTC Calculation: 460 R Axis:   31 Text Interpretation: Sinus rhythm Since last tracing rate faster Otherwise no significant change Confirmed by Daleen Bo [phone]) on 07/28/2020 10:48:06 PM   Radiology DG Chest 2 View  Result Date: 07/28/2020 CLINICAL DATA:  Post fall with hip pain.  Chest wall pain. EXAM: CHEST - 2 VIEW COMPARISON:  09/14/2017 FINDINGS: Stable heart size and mediastinal contours. Stable upper normal heart size. The lungs are clear. Pulmonary vasculature is normal. No consolidation, pleural effusion, or pneumothorax. Left ribs are better assessed on dedicated rib films, reported separately. No acute osseous abnormalities are seen. Multiple surgical clips overlie the chest wall. IMPRESSION: No acute chest findings. Electronically Signed   By: Keith Rake M.D.   On: 07/28/2020 21:04   DG Ribs Unilateral Left  Result Date:  07/28/2020 CLINICAL DATA:  Fall with chest wall pain. EXAM: LEFT RIBS - 2 VIEW COMPARISON:  Concurrent chest radiograph. FINDINGS: Cortical margins of the left ribs are intact. No fracture or other bone lesions are seen involving the ribs. IMPRESSION: No fracture of left ribs. Electronically Signed   By: Keith Rake M.D.   On: 07/28/2020 21:08   DG  Hip Unilat With Pelvis 2-3 Views Left  Result Date: 07/28/2020 CLINICAL DATA:  Fall EXAM: DG HIP (WITH OR WITHOUT PELVIS) 2-3V LEFT COMPARISON:  Acute abdominal series 08/10/2008 FINDINGS: There is a transcervical left femoral neck fracture with foreshortening across the fracture line. Questionable fragmentation versus enthesopathic change involving the greater trochanter as well. Surrounding soft tissue swelling is noted. No other acute fracture or traumatic osseous injury is seen involving the bones of the pelvis or the contralateral proximal right femur. Femoral heads remain normally located. Degenerative changes in the spine, hips and pelvis. Prior right lower abdominal hernia mesh repair. Additional surgical clips in the pelvis. IMPRESSION: 1. Transcervical left femoral neck fracture with foreshortening across the fracture line. 2. Questionable fragmentation versus enthesopathic change involving the left greater trochanter as well. Electronically Signed   By: Lovena Le M.D.   On: 07/28/2020 21:09    Procedures Procedures   Medications Ordered in ED Medications  morphine 2 MG/ML injection 1-2 mg (has no administration in time range)  ondansetron (ZOFRAN) injection 4 mg (has no administration in time range)  sodium polystyrene (KAYEXALATE) 15 GM/60ML suspension 15 g (has no administration in time range)  ceFAZolin (ANCEF) IVPB 2g/100 mL premix (has no administration in time range)  sodium bicarbonate 150 mEq in sterile water 1,000 mL infusion (has no administration in time range)  HYDROcodone-acetaminophen (NORCO/VICODIN) 5-325 MG per tablet 1  tablet (1 tablet Oral Given 07/28/20 2128)  sodium chloride 0.9 % bolus 500 mL (0 mLs Intravenous Stopped 07/28/20 2218)    ED Course  I have reviewed the triage vital signs and the nursing notes.  Pertinent labs & imaging results that were available during my care of the patient were reviewed by me and considered in my medical decision making (see chart for details).  Clinical Course as of 07/28/20 2248  Mon Jul 28, 2020  2202 Discussed with Dr. Marcelino Scot, from orthopedics who will arrange for surgical repair of left hip fracture, tomorrow [EW]    Clinical Course User Index [EW] Daleen Bo, MD   MDM Rules/Calculators/A&P                           Patient Vitals for the past 24 hrs:  BP Temp Temp src Pulse Resp SpO2  07/28/20 2200 (!) 157/142 -- -- 84 19 100 %  07/28/20 2122 (!) 178/66 -- -- 82 19 100 %  07/28/20 1702 (!) 158/64 98.2 F (36.8 C) Oral 70 17 100 %  07/28/20 1419 (!) 162/69 98.4 F (36.9 C) Oral 72 18 99 %    10:02 PM Reevaluation with update and discussion. After initial assessment and treatment, an updated evaluation reveals she remains fairly comfortable has no further complaints.  Findings discussed and questions. Daleen Bo   Medical Decision Making:  This patient is presenting for evaluation of injury to left hip with mechanical fall, 3 days, which does require a range of treatment options, and is a complaint that involves a moderate risk of morbidity and mortality. The differential diagnoses include fracture, contusion, dislocation. I decided to review old records, and in summary Eh[place] female, injured in fall, 3 days ago, presenting now with persistent left hip pain.  I did not additional historical information from Anyone.  Clinical Laboratory Tests Ordered, included CBC, Metabolic panel and Covid screening.  Radiologic Tests Ordered, included x-rays left hip and pelvis, chest and left ribs.  I independently Visualized: Radiographic images, which show left  hip surgical neck fracture and possible trochanter fracture, no rib or chest abnormality    Critical Interventions-clinical evaluation, laboratory testing, pain medication, radiography, observation and reassessment  After These Interventions, the Patient was reevaluated and was found with fracture left hip, 31 days old, without further injuries.  Patient requires hospitalization for surgical repair.  CRITICAL CARE-no Performed by: Daleen Bo  Nursing Notes Reviewed/ Care Coordinated Applicable Imaging Reviewed Interpretation of Laboratory Data incorporated into ED treatment   10:04 PM-Consult complete with hospitalist. Patient case explained and discussed.  He agrees to admit patient for further evaluation and treatment. Call ended at 10:15 PM  Plan: Admit  Final Clinical Impression(s) / ED Diagnoses Final diagnoses:  Pain  Closed fracture of left hip, initial encounter [facility])  Multiple contusions  Fall, initial encounter    Rx / DC Orders ED Discharge Orders    None       Daleen Bo, MD 07/28/20 2300

## 2020-07-28 NOTE — H&P (Signed)
History and Physical    [name] YHC:[id number] DOB: June 30, 1945 DOA: 07/28/2020  PCP: Carolee Rota, NP   Patient coming from: home   Chief Complaint: Left hip pain, left chest wall pain   HPI: [name] is a 76 y.o. female with medical history significant for breast cancer more than 30 years ago, hypertension, insulin-dependent diabetes mellitus, and hypothyroidism, who presents with severe pain involving the left hip and left chest wall after a mechanical fall at home on 07/26/2020.  Patient reports that she had a mild respiratory illness approximately 1 month ago, had completely recovered from that and had been in her usual state of health when she tripped in her yard, landing on her left side.  She was going up one step onto a wooden deck when she tripped and believes that she impacted her chest on the edge of that deck.  She had immediate and severe pain in her chest and left hip and was unable to stand.  She describes dragging herself across her yard to her house where she was able to reach a grandson who has been assisting her.  She has been unable to bear weight on the left leg since the time of injury, severe pain has persisted, and so she eventually came into the ED for evaluation today.  She denies any of her head or losing consciousness.  She denies any fevers, chills, cough, or shortness of breath.  She describes herself as normally healthy and is active doing yard work and caring for a 37-year-old relative.  She is able to ascend a flight of stairs without stopping and never experiences chest pain with activity.  ED Course: Upon arrival to the ED, patient is found to be afebrile, saturating well on room air, and with blood pressure 178/66.  EKG features sinus rhythm.  Chest x-ray negative for acute cardiopulmonary disease.  Dedicated plain films of the left ribs are negative for acute fracture.  X-ray of the left hip and pelvis reveals transcervical left femoral neck fracture.   Chemistry panel notable for glucose 265, potassium 6.3, bicarbonate 15, anion gap 16, creatinine 1.24, AST 51, and bilirubin 2.7.  CBC notable for a normocytic anemia with hemoglobin 10.0.  Patient was given 500 cc of saline and 5 mg Norco in the ED.  Orthopedic surgery was consulted by the ED physician.  COVID-19 screening test not yet resulted.  Review of Systems:  All other systems reviewed and apart from HPI, are negative.  Past Medical History:  Diagnosis Date  . Cancer [facility])    prior breast cancer 25 years ago   . Diabetes mellitus, type II, insulin dependent ([place]) 07/28/2020  . Essential hypertension 07/28/2020  . Hypothyroidism, unspecified 07/28/2020    Past Surgical History:  Procedure Laterality Date  . CHOLECYSTECTOMY    . HERNIA REPAIR    . MASTECTOMY      Social History:   reports that she has never smoked. She has never used smokeless tobacco. She reports previous alcohol use. She reports that she does not use drugs.  Allergies  Allergen Reactions  . Sulfa Antibiotics Nausea And Vomiting    Family History  Problem Relation Age of Onset  . Breast cancer Mother      Prior to Admission medications   Not on File    Physical Exam: Vitals:   07/28/20 1419 07/28/20 1702 07/28/20 2122 07/28/20 2200  BP: (!) 162/69 (!) 158/64 (!) 178/66 (!) 157/142  Pulse: 72 70 82 84  Resp: 18 17 19 19   Temp: 98.4 F (36.9 C) 98.2 F (36.8 C)    TempSrc: Oral Oral    SpO2: 99% 100% 100% 100%    Constitutional: NAD, calm  Eyes: PERTLA, lids and conjunctivae normal ENMT: Mucous membranes are moist. Posterior pharynx clear of any exudate or lesions.   Neck: ecchymosis left anterior neck, supple, no masses  Respiratory: no wheezing, no crackles. No accessory muscle use.  Cardiovascular: S1 & S2 heard, regular rate and rhythm. No extremity edema.   Abdomen: No distension, no tenderness, soft. Bowel sounds active.  Musculoskeletal: no clubbing / cyanosis. Left hip tenderness,  neurovascularly intact distally.    Skin: no significant rashes, lesions, ulcers. Warm, dry, well-perfused. Poor turgor.  Neurologic: CN 2-12 grossly intact. Sensation intact. Strength 5/5 in all 4 limbs.  Psychiatric: Alert and oriented to person, place, and situation. Very pleasant and cooperative.    Labs and Imaging on Admission: I have personally reviewed following labs and imaging studies  CBC: Recent Labs  Lab 07/28/20 2216  WBC 8.1  NEUTROABS 6.5  HGB 10.0*  HCT 30.4*  MCV 92.7  PLT 272   Basic Metabolic Panel: Recent Labs  Lab 07/28/20 2111  NA 133*  K 6.3*  CL 102  CO2 15*  GLUCOSE 265*  BUN 17  CREATININE 1.24*  CALCIUM 8.7*   GFR: CrCl cannot be calculated (Unknown ideal weight.). Liver Function Tests: Recent Labs  Lab 07/28/20 2111  AST 51*  ALT 15  ALKPHOS 76  BILITOT 2.7*  PROT 6.8  ALBUMIN 3.6   No results for input(s): LIPASE, AMYLASE in the last 168 hours. No results for input(s): AMMONIA in the last 168 hours. Coagulation Profile: No results for input(s): INR, PROTIME in the last 168 hours. Cardiac Enzymes: No results for input(s): CKTOTAL, CKMB, CKMBINDEX, TROPONINI in the last 168 hours. BNP (last 3 results) No results for input(s): PROBNP in the last 8760 hours. HbA1C: No results for input(s): HGBA1C in the last 72 hours. CBG: No results for input(s): GLUCAP in the last 168 hours. Lipid Profile: No results for input(s): CHOL, HDL, LDLCALC, TRIG, CHOLHDL, LDLDIRECT in the last 72 hours. Thyroid Function Tests: No results for input(s): TSH, T4TOTAL, FREET4, T3FREE, THYROIDAB in the last 72 hours. Anemia Panel: No results for input(s): VITAMINB12, FOLATE, FERRITIN, TIBC, IRON, RETICCTPCT in the last 72 hours. Urine analysis:    Component Value Date/Time   COLORURINE ORANGE BIOCHEMICALS MAY BE AFFECTED BY COLOR (A) 08/12/2008 1518   APPEARANCEUR CLOUDY (A) 08/12/2008 1518   LABSPEC 1.029 08/12/2008 1518   PHURINE 6.0 08/12/2008  1518   GLUCOSEU 500 (A) 08/12/2008 1518   HGBUR LARGE (A) 08/12/2008 1518   BILIRUBINUR MODERATE (A) 08/12/2008 1518   KETONESUR 15 (A) 08/12/2008 1518   PROTEINUR >300 (A) 08/12/2008 1518   UROBILINOGEN 1.0 08/12/2008 1518   NITRITE POSITIVE (A) 08/12/2008 1518   LEUKOCYTESUR SMALL (A) 08/12/2008 1518   Sepsis Labs: @LABRCNTIP (procalcitonin:4,lacticidven:4) )No results found for this or any previous visit (from the past 240 hour(s)).   Radiological Exams on Admission: DG Chest 2 View  Result Date: 07/28/2020 CLINICAL DATA:  Post fall with hip pain.  Chest wall pain. EXAM: CHEST - 2 VIEW COMPARISON:  09/14/2017 FINDINGS: Stable heart size and mediastinal contours. Stable upper normal heart size. The lungs are clear. Pulmonary vasculature is normal. No consolidation, pleural effusion, or pneumothorax. Left ribs are better assessed on dedicated rib films, reported separately. No acute osseous abnormalities are seen. Multiple  surgical clips overlie the chest wall. IMPRESSION: No acute chest findings. Electronically Signed   By: Keith Rake M.D.   On: 07/28/2020 21:04   DG Ribs Unilateral Left  Result Date: 07/28/2020 CLINICAL DATA:  Fall with chest wall pain. EXAM: LEFT RIBS - 2 VIEW COMPARISON:  Concurrent chest radiograph. FINDINGS: Cortical margins of the left ribs are intact. No fracture or other bone lesions are seen involving the ribs. IMPRESSION: No fracture of left ribs. Electronically Signed   By: Keith Rake M.D.   On: 07/28/2020 21:08   DG Hip Unilat With Pelvis 2-3 Views Left  Result Date: 07/28/2020 CLINICAL DATA:  Fall EXAM: DG HIP (WITH OR WITHOUT PELVIS) 2-3V LEFT COMPARISON:  Acute abdominal series 08/10/2008 FINDINGS: There is a transcervical left femoral neck fracture with foreshortening across the fracture line. Questionable fragmentation versus enthesopathic change involving the greater trochanter as well. Surrounding soft tissue swelling is noted. No other acute  fracture or traumatic osseous injury is seen involving the bones of the pelvis or the contralateral proximal right femur. Femoral heads remain normally located. Degenerative changes in the spine, hips and pelvis. Prior right lower abdominal hernia mesh repair. Additional surgical clips in the pelvis. IMPRESSION: 1. Transcervical left femoral neck fracture with foreshortening across the fracture line. 2. Questionable fragmentation versus enthesopathic change involving the left greater trochanter as well. Electronically Signed   By: Lovena Le M.D.   On: 07/28/2020 21:09    EKG: Independently reviewed. Sinus rhythm.   Assessment/Plan  1. Left hip fracture  - Presents with severe left hip pain and inability to bear weight after a mechanical fall on 07/26/20 and is found to have transcervical left femur fracture  - Orthopedic surgery consulting and much appreciated  - Recommend correction of hyperkalemia as below prior to surgery, and then [name] will present an estimated 0.5% risk of perioperative MI or cardiac arrest  - Keep NPO after midnight, hold lisinopril perioperatively, address renal/electrolyte issues as below, continue pain-control    2. Mild renal insufficiency; hyperkalemia; metabolic acidosis  - SCr is 1.24 on admission without recent labs for comparison, serum potassium is 6.3 without EKG changes, and serum bicarbonate is 15 with AG 16  - Check serum CK and UA for suspected mild rhabdomyolysis, treat hyperkalemia with Lokelma, hold lisinopril-HCTZ, repeat potassium level overnight, start isotonic bicarbonate infusion, and repeat chem panel in am    3. Insulin-dependent DM  - Check CBGs and continue insulin    4. Hypertension  - Hold lisinopril and HCTZ in setting of hyperkalemia and hypovolemia  - Treat as-needed only for now   5. Hypothyroidism  - Continue Synthroid    6. Anemia  - Hgb is 10.0 on admission without overt bleeding, was 10-11 range in 2010  - Type and  screen, monitor    7. Hyperbilirubinemia - Total bilirubin 2.7 on admission  - No RUQ tenderness, has been elevated intermittently in the past  - Fractionate, trend     DVT prophylaxis: SCDs Code Status: Full  Level of Care: Level of care: Telemetry Medical Family Communication: None available  Disposition Plan:  Patient is from: Home  Anticipated d/c is to: TBD Anticipated d/c date is: 07/31/20 Patient currently: Pending orthopedic consultation, likely operative hip repair, treatment of hyperkalemia and suspected mild rhabdomyolysis  Consults called: Orthopedic surgery  Admission status: Inpatient     Vianne Bulls, MD [facility]  07/28/2020, 10:53 PM

## 2020-07-28 NOTE — ED Triage Notes (Signed)
Pt from home via [facility] for eval of left leg pain and chest wall pain after mechanical fall, missed a step going out to her pool, on Saturday.

## 2020-07-29 DIAGNOSIS — S72002A Fracture of unspecified part of neck of left femur, initial encounter for closed fracture: Secondary | ICD-10-CM | POA: Diagnosis not present

## 2020-07-29 LAB — COMPREHENSIVE METABOLIC PANEL
ALT: 16 U/L (ref 0–44)
AST: 25 U/L (ref 15–41)
Albumin: 3.2 g/dL — ABNORMAL LOW (ref 3.5–5.0)
Alkaline Phosphatase: 67 U/L (ref 38–126)
Anion gap: 13 (ref 5–15)
BUN: 16 mg/dL (ref 8–23)
CO2: 18 mmol/L — ABNORMAL LOW (ref 22–32)
Calcium: 8.4 mg/dL — ABNORMAL LOW (ref 8.9–10.3)
Chloride: 104 mmol/L (ref 98–111)
Creatinine, Ser: 1.22 mg/dL — ABNORMAL HIGH (ref 0.44–1.00)
GFR, Estimated: 46 mL/min — ABNORMAL LOW (ref >60)
Glucose, Bld: 331 mg/dL — ABNORMAL HIGH (ref 70–99)
Potassium: 4.3 mmol/L (ref 3.5–5.1)
Sodium: 135 mmol/L (ref 135–145)
Total Bilirubin: 1.9 mg/dL — ABNORMAL HIGH (ref 0.3–1.2)
Total Protein: 6.1 g/dL — ABNORMAL LOW (ref 6.5–8.1)

## 2020-07-29 LAB — CBC
HCT: 30.4 % — ABNORMAL LOW (ref 36.0–46.0)
Hemoglobin: 9.9 g/dL — ABNORMAL LOW (ref 12.0–15.0)
MCH: 30.3 pg (ref 26.0–34.0)
MCHC: 32.6 g/dL (ref 30.0–36.0)
MCV: 93 fL (ref 80.0–100.0)
Platelets: 163 10*3/uL (ref 150–400)
RBC: 3.27 MIL/uL — ABNORMAL LOW (ref 3.87–5.11)
RDW: 13 % (ref 11.5–15.5)
WBC: 7.8 10*3/uL (ref 4.0–10.5)
nRBC: 0 % (ref 0.0–0.2)

## 2020-07-29 LAB — TYPE AND SCREEN
ABO/RH(D): A POS
Antibody Screen: NEGATIVE

## 2020-07-29 LAB — CBG MONITORING, ED
Glucose-Capillary: 303 mg/dL — ABNORMAL HIGH (ref 70–99)
Glucose-Capillary: 172 mg/dL — ABNORMAL HIGH (ref 70–99)
Glucose-Capillary: 134 mg/dL — ABNORMAL HIGH (ref 70–99)
Glucose-Capillary: 138 mg/dL — ABNORMAL HIGH (ref 70–99)
Glucose-Capillary: 106 mg/dL — ABNORMAL HIGH (ref 70–99)

## 2020-07-29 LAB — BILIRUBIN, FRACTIONATED(TOT/DIR/INDIR)
Bilirubin, Direct: 0.4 mg/dL — ABNORMAL HIGH (ref 0.0–0.2)
Indirect Bilirubin: 1.5 mg/dL — ABNORMAL HIGH (ref 0.3–0.9)
Total Bilirubin: 1.9 mg/dL — ABNORMAL HIGH (ref 0.3–1.2)

## 2020-07-29 LAB — POTASSIUM: Potassium: 4.4 mmol/L (ref 3.5–5.1)

## 2020-07-29 LAB — HEMOGLOBIN A1C
Hgb A1c MFr Bld: 9.5 % — ABNORMAL HIGH (ref 4.8–5.6)
Mean Plasma Glucose: 225.95 mg/dL

## 2020-07-29 LAB — GLUCOSE, CAPILLARY
Glucose-Capillary: 153 mg/dL — ABNORMAL HIGH (ref 70–99)
Glucose-Capillary: 257 mg/dL — ABNORMAL HIGH (ref 70–99)

## 2020-07-29 LAB — VITAMIN D 25 HYDROXY (VIT D DEFICIENCY, FRACTURES): Vit D, 25-Hydroxy: 17.84 ng/mL — ABNORMAL LOW (ref 30–100)

## 2020-07-29 LAB — ABO/RH: ABO/RH(D): A POS

## 2020-07-29 MED ORDER — INSULIN GLARGINE 100 UNIT/ML ~~LOC~~ SOLN
20.0000 [IU] | Freq: Every day | SUBCUTANEOUS | Status: DC
  Filled 2020-07-29: qty 0.2

## 2020-07-29 MED ORDER — GUAIFENESIN 100 MG/5ML PO SOLN
5.0000 mL | Freq: Once | ORAL | Status: DC
  Filled 2020-07-29: qty 5

## 2020-07-29 MED ORDER — POVIDONE-IODINE 10 % EX SWAB: 2.0000 "application " | Freq: Once | CUTANEOUS | Status: DC

## 2020-07-29 MED ORDER — CHLORHEXIDINE GLUCONATE 4 % EX LIQD
60.0000 mL | Freq: Once | CUTANEOUS | Status: AC
  Administered 2020-07-30: 4 via TOPICAL
  Filled 2020-07-29: qty 15
  Filled 2020-07-29: qty 60

## 2020-07-29 MED ORDER — INSULIN GLARGINE 100 UNIT/ML ~~LOC~~ SOLN
15.0000 [IU] | Freq: Every day | SUBCUTANEOUS | Status: DC
  Administered 2020-07-29: 15 [IU] via SUBCUTANEOUS
  Filled 2020-07-29 (×2): qty 0.15

## 2020-07-29 NOTE — ED Notes (Signed)
Attempted report x1. 

## 2020-07-29 NOTE — H&P (View-Only) (Signed)
Reason for Consult:  Left hip fracture Referring Physician: ED Physician  [name] is an 75 y.o. female.  HPI: She states that she fell, 3 days ago, when she slipped, while working on her pool.  After that she was unable stand up and walk but was able to get herself into her house by scooting on her bottom, and up the steps.  Family members helped her to her bed.  EMS was summoned to the home but patient was not transported at that time.  She presents today for worsening pain in the left hip region.  She also complains of pain in her left ribs, and her neck where she was injured when she fell.  She denies headache, back pain, focal weakness or paresthesia.  There are no other known active modifying factors.  Ortho consulted.  Risks, benefits and expectations were discussed with the patient.  Risks including but not limited to the risk of anesthesia, blood clots, nerve damage, blood vessel damage, failure of the prosthesis, infection and up to and including death.  Patient understand the risks, benefits and expectations and wishes to proceed with surgery.   Past Medical History:  Diagnosis Date  . Cancer [facility])    prior breast cancer 25 years ago   . Diabetes mellitus, type II, insulin dependent ([place]) 07/28/2020  . Essential hypertension 07/28/2020  . Hypothyroidism, unspecified 07/28/2020    Past Surgical History:  Procedure Laterality Date  . CHOLECYSTECTOMY    . HERNIA REPAIR    . MASTECTOMY      Family History  Problem Relation Age of Onset  . Breast cancer Mother     Social History:  reports that she has never smoked. She has never used smokeless tobacco. She reports previous alcohol use. She reports that she does not use drugs.  Allergies:  Allergies  Allergen Reactions  . Calcium     Other reaction(s): nausea  . Sulfa Antibiotics Nausea And Vomiting    Medications: I have reviewed the patient's current medications.  Results for orders placed or performed during the  hospital encounter of 07/28/20 (from the past 48 hour(s))  Comprehensive metabolic panel     Status: Abnormal   Collection Time: 07/28/20  9:11 PM  Result Value Ref Range   Sodium 133 (L) 135 - 145 mmol/L   Potassium 6.3 (HH) 3.5 - 5.1 mmol/L    Comment: CRITICAL RESULT CALLED TO, READ BACK BY AND VERIFIED WITH: SLIGHT HEMOLYSIS  D. SIDBURY RN @2232  07/28/2020 K. SANDERS    Chloride 102 98 - 111 mmol/L   CO2 15 (L) 22 - 32 mmol/L   Glucose, Bld 265 (H) 70 - 99 mg/dL    Comment: Glucose reference range applies only to samples taken after fasting for at least 8 hours.   BUN 17 8 - 23 mg/dL   Creatinine, Ser 1.24 (H) 0.44 - 1.00 mg/dL   Calcium 8.7 (L) 8.9 - 10.3 mg/dL   Total Protein 6.8 6.5 - 8.1 g/dL   Albumin 3.6 3.5 - 5.0 g/dL   AST 51 (H) 15 - 41 U/L   ALT 15 0 - 44 U/L   Alkaline Phosphatase 76 38 - 126 U/L   Total Bilirubin 2.7 (H) 0.3 - 1.2 mg/dL   GFR, Estimated 45 (L) >60 mL/min    Comment: (NOTE) Calculated using the CKD-EPI Creatinine Equation (2021)    Anion gap 16 (H) 5 - 15    Comment: Performed at [facility], [place]  [address] [place], [place] [address]  Resp Panel by RT-PCR (Flu A&B, Covid) Nasopharyngeal Swab     Status: None   Collection Time: 07/28/20  9:32 PM   Specimen: Nasopharyngeal Swab; Nasopharyngeal(NP) swabs in vial transport medium  Result Value Ref Range   SARS Coronavirus 2 by RT PCR NEGATIVE NEGATIVE    Comment: (NOTE) SARS-CoV-2 target nucleic acids are NOT DETECTED.  The SARS-CoV-2 RNA is generally detectable in upper respiratory specimens during the acute phase of infection. The lowest concentration of SARS-CoV-2 viral copies this assay can detect is 138 copies/mL. A negative result does not preclude SARS-Cov-2 infection and should not be used as the sole basis for treatment or other patient management decisions. A negative result may occur with  improper specimen collection/handling, submission of specimen other than nasopharyngeal  swab, presence of viral mutation(s) within the areas targeted by this assay, and inadequate number of viral copies(<138 copies/mL). A negative result must be combined with clinical observations, patient history, and epidemiological information. The expected result is Negative.  Fact Sheet for Patients:  EntrepreneurPulse.com.au  Fact Sheet for Healthcare Providers:  IncredibleEmployment.be  This test is no t yet approved or cleared by the [place] FDA and  has been authorized for detection and/or diagnosis of SARS-CoV-2 by FDA under an Emergency Use Authorization (EUA). This EUA will remain  in effect (meaning this test can be used) for the duration of the COVID-19 declaration under Section 564(b)(1) of the Act, 21 U.S.C.section 360bbb-3(b)(1), unless the authorization is terminated  or revoked sooner.       Influenza A by PCR NEGATIVE NEGATIVE   Influenza B by PCR NEGATIVE NEGATIVE    Comment: (NOTE) The Xpert Xpress SARS-CoV-2/FLU/RSV plus assay is intended as an aid in the diagnosis of influenza from Nasopharyngeal swab specimens and should not be used as a sole basis for treatment. Nasal washings and aspirates are unacceptable for Xpert Xpress SARS-CoV-2/FLU/RSV testing.  Fact Sheet for Patients: EntrepreneurPulse.com.au  Fact Sheet for Healthcare Providers: IncredibleEmployment.be  This test is not yet approved or cleared by the [place] FDA and has been authorized for detection and/or diagnosis of SARS-CoV-2 by FDA under an Emergency Use Authorization (EUA). This EUA will remain in effect (meaning this test can be used) for the duration of the COVID-19 declaration under Section 564(b)(1) of the Act, 21 U.S.C. section 360bbb-3(b)(1), unless the authorization is terminated or revoked.  Performed at [facility], [place] [address], [place], [place] [address]   CBC with  Differential/Platelet     Status: Abnormal   Collection Time: 07/28/20 10:16 PM  Result Value Ref Range   WBC 8.1 4.0 - 10.5 K/uL   RBC 3.28 (L) 3.87 - 5.11 MIL/uL   Hemoglobin 10.0 (L) 12.0 - 15.0 g/dL   HCT 30.4 (L) 36.0 - 46.0 %   MCV 92.7 80.0 - 100.0 fL   MCH 30.5 26.0 - 34.0 pg   MCHC 32.9 30.0 - 36.0 g/dL   RDW 12.9 11.5 - 15.5 %   Platelets 168 150 - 400 K/uL   nRBC 0.0 0.0 - 0.2 %   Neutrophils Relative % 79 %   Neutro Abs 6.5 1.7 - 7.7 K/uL   Lymphocytes Relative 10 %   Lymphs Abs 0.8 0.7 - 4.0 K/uL   Monocytes Relative 9 %   Monocytes Absolute 0.7 0.1 - 1.0 K/uL   Eosinophils Relative 1 %   Eosinophils Absolute 0.1 0.0 - 0.5 K/uL   Basophils Relative 1 %  Basophils Absolute 0.1 0.0 - 0.1 K/uL   Immature Granulocytes 0 %   Abs Immature Granulocytes 0.03 0.00 - 0.07 K/uL    Comment: Performed at [facility], [place] [address] [place], [place] [address]  ABO/Rh     Status: None   Collection Time: 07/28/20 10:16 PM  Result Value Ref Range   ABO/RH(D)      A POS Performed at [facility] [address] [place], [place] [address]   CK     Status: Abnormal   Collection Time: 07/28/20 10:49 PM  Result Value Ref Range   Total CK 301 (H) 38 - 234 U/L    Comment: Performed at [facility], [place] [address] [place], [place] [address]  Potassium     Status: None   Collection Time: 07/28/20 10:49 PM  Result Value Ref Range   Potassium 4.3 3.5 - 5.1 mmol/L    Comment: DELTA CHECK NOTED Performed at [facility], [place] [address] [place], [place] [address]   Hemoglobin A1c     Status: Abnormal   Collection Time: 07/28/20 10:51 PM  Result Value Ref Range   Hgb A1c MFr Bld 9.5 (H) 4.8 - 5.6 %    Comment: (NOTE) Pre diabetes:          5.7%-6.4%  Diabetes:              >6.4%  Glycemic control for   <7.0% adults with diabetes    Mean Plasma Glucose 225.95 mg/dL    Comment: Performed at [facility] [address] [place], [place]  [address]  Urinalysis, Complete w Microscopic     Status: Abnormal   Collection Time: 07/28/20 10:53 PM  Result Value Ref Range   Color, Urine YELLOW YELLOW   APPearance CLEAR CLEAR   Specific Gravity, Urine 1.020 1.005 - 1.030   pH 5.0 5.0 - 8.0   Glucose, UA 50 (A) NEGATIVE mg/dL   Hgb urine dipstick NEGATIVE NEGATIVE   Bilirubin Urine NEGATIVE NEGATIVE   Ketones, ur 80 (A) NEGATIVE mg/dL   Protein, ur NEGATIVE NEGATIVE mg/dL   Nitrite NEGATIVE NEGATIVE   Leukocytes,Ua TRACE (A) NEGATIVE   RBC / HPF 0-5 0 - 5 RBC/hpf   WBC, UA 21-50 0 - 5 WBC/hpf   Bacteria, UA NONE SEEN NONE SEEN    Comment: Performed at [facility] [address], [place], [place] [address]  Bilirubin, fractionated(tot/dir/indir)     Status: Abnormal   Collection Time: 07/28/20 10:57 PM  Result Value Ref Range   Total Bilirubin 1.9 (H) 0.3 - 1.2 mg/dL   Bilirubin, Direct 0.4 (H) 0.0 - 0.2 mg/dL   Indirect Bilirubin 1.5 (H) 0.3 - 0.9 mg/dL    Comment: Performed at [facility] [address], [place], [place] [address]  Potassium     Status: None   Collection Time: 07/28/20 10:57 PM  Result Value Ref Range   Potassium 4.4 3.5 - 5.1 mmol/L    Comment: Performed at [facility], [place] [address], [place], [place] [address]  Type and screen [place]     Status: None   Collection Time: 07/28/20 11:37 PM  Result Value Ref Range   ABO/RH(D) A POS    Antibody Screen NEG    Sample Expiration      07/31/2020,2359 Performed at [facility], [place] [address] [place],  [address]   CBC  Status: Abnormal   Collection Time: 07/29/20  4:15 AM  Result Value Ref Range   WBC 7.8 4.0 - 10.5 K/uL   RBC 3.27 (L) 3.87 - 5.11 MIL/uL   Hemoglobin 9.9 (L) 12.0 - 15.0 g/dL   HCT 30.4 (L) 36.0 - 46.0 %   MCV 93.0 80.0 - 100.0 fL   MCH 30.3 26.0 - 34.0 pg   MCHC 32.6 30.0 - 36.0 g/dL   RDW 13.0 11.5 - 15.5 %   Platelets 163 150 - 400 K/uL   nRBC 0.0 0.0 - 0.2 %    Comment:  Performed at [facility], [place] [address], [place], [place] [address]  VITAMIN D 25 Hydroxy (Vit-D Deficiency, Fractures)     Status: Abnormal   Collection Time: 07/29/20  4:15 AM  Result Value Ref Range   Vit D, 25-Hydroxy 17.84 (L) 30 - 100 ng/mL    Comment: (NOTE) Vitamin D deficiency has been defined by the Institute of Medicine  and an Endocrine Society practice guideline as a level of serum 25-OH  vitamin D less than 20 ng/mL (1,2). The Endocrine Society went on to  further define vitamin D insufficiency as a level between 21 and 29  ng/mL (2).  1. IOM (Institute of Medicine). 2010. Dietary reference intakes for  calcium and D. [place]: The [employer]. 2. Holick MF, Binkley [place], Bischoff-Ferrari HA, et al. Evaluation,  treatment, and prevention of vitamin D deficiency: an Endocrine  Society clinical practice guideline, JCEM. 2011 Jul; 96(7): 1911-30.  Performed at [facility], [place] [address], [place], [place] [address]   Comprehensive metabolic panel     Status: Abnormal   Collection Time: 07/29/20  4:15 AM  Result Value Ref Range   Sodium 135 135 - 145 mmol/L   Potassium 4.3 3.5 - 5.1 mmol/L   Chloride 104 98 - 111 mmol/L   CO2 18 (L) 22 - 32 mmol/L   Glucose, Bld 331 (H) 70 - 99 mg/dL    Comment: Glucose reference range applies only to samples taken after fasting for at least 8 hours.   BUN 16 8 - 23 mg/dL   Creatinine, Ser 1.22 (H) 0.44 - 1.00 mg/dL   Calcium 8.4 (L) 8.9 - 10.3 mg/dL   Total Protein 6.1 (L) 6.5 - 8.1 g/dL   Albumin 3.2 (L) 3.5 - 5.0 g/dL   AST 25 15 - 41 U/L   ALT 16 0 - 44 U/L   Alkaline Phosphatase 67 38 - 126 U/L   Total Bilirubin 1.9 (H) 0.3 - 1.2 mg/dL   GFR, Estimated 46 (L) >60 mL/min    Comment: (NOTE) Calculated using the CKD-EPI Creatinine Equation (2021)    Anion gap 13 5 - 15    Comment: Performed at [facility], [place] [address], [place], [place] [address]  CBG monitoring, ED     Status: Abnormal    Collection Time: 07/29/20  4:33 AM  Result Value Ref Range   Glucose-Capillary 303 (H) 70 - 99 mg/dL    Comment: Glucose reference range applies only to samples taken after fasting for at least 8 hours.  CBG monitoring, ED     Status: Abnormal   Collection Time: 07/29/20  8:45 AM  Result Value Ref Range   Glucose-Capillary 172 (H) 70 - 99 mg/dL    Comment: Glucose reference range applies only to samples taken after fasting for at least 8 hours.    DG Chest 2  View  Result Date: 07/28/2020 CLINICAL DATA:  Post fall with hip pain.  Chest wall pain. EXAM: CHEST - 2 VIEW COMPARISON:  09/14/2017 FINDINGS: Stable heart size and mediastinal contours. Stable upper normal heart size. The lungs are clear. Pulmonary vasculature is normal. No consolidation, pleural effusion, or pneumothorax. Left ribs are better assessed on dedicated rib films, reported separately. No acute osseous abnormalities are seen. Multiple surgical clips overlie the chest wall. IMPRESSION: No acute chest findings. Electronically Signed   By: Keith Rake M.D.   On: 07/28/2020 21:04   DG Ribs Unilateral Left  Result Date: 07/28/2020 CLINICAL DATA:  Fall with chest wall pain. EXAM: LEFT RIBS - 2 VIEW COMPARISON:  Concurrent chest radiograph. FINDINGS: Cortical margins of the left ribs are intact. No fracture or other bone lesions are seen involving the ribs. IMPRESSION: No fracture of left ribs. Electronically Signed   By: Keith Rake M.D.   On: 07/28/2020 21:08   DG Knee Left Port  Result Date: 07/28/2020 CLINICAL DATA:  Left leg pain following fall, initial encounter EXAM: PORTABLE LEFT KNEE - 2 VIEW COMPARISON:  None. FINDINGS: Degenerative changes are noted in the left knee joint primarily in the patellofemoral and medial joint space. No acute fracture or dislocation is noted. No soft tissue abnormality is seen. IMPRESSION: Degenerative change without acute abnormality. Electronically Signed   By: Inez Catalina M.D.   On:  07/28/2020 23:08   DG Hip Unilat With Pelvis 2-3 Views Left  Result Date: 07/28/2020 CLINICAL DATA:  Fall EXAM: DG HIP (WITH OR WITHOUT PELVIS) 2-3V LEFT COMPARISON:  Acute abdominal series 08/10/2008 FINDINGS: There is a transcervical left femoral neck fracture with foreshortening across the fracture line. Questionable fragmentation versus enthesopathic change involving the greater trochanter as well. Surrounding soft tissue swelling is noted. No other acute fracture or traumatic osseous injury is seen involving the bones of the pelvis or the contralateral proximal right femur. Femoral heads remain normally located. Degenerative changes in the spine, hips and pelvis. Prior right lower abdominal hernia mesh repair. Additional surgical clips in the pelvis. IMPRESSION: 1. Transcervical left femoral neck fracture with foreshortening across the fracture line. 2. Questionable fragmentation versus enthesopathic change involving the left greater trochanter as well. Electronically Signed   By: Lovena Le M.D.   On: 07/28/2020 21:09    Review of Systems  Constitutional: Negative.   HENT: Negative.   Eyes: Negative.   Respiratory: Negative.   Cardiovascular: Negative.   Gastrointestinal: Negative.   Genitourinary: Negative.   Musculoskeletal: Positive for joint pain.  Skin: Negative.   Neurological: Negative.   Endo/Heme/Allergies: Negative.   Psychiatric/Behavioral: Negative.     Blood pressure (!) 145/59, pulse 68, temperature 98.2 F (36.8 C), temperature source Oral, resp. rate 14, SpO2 97 %. Physical Exam Constitutional:      Appearance: She is well-developed.  HENT:     Head: Normocephalic.  Eyes:     Pupils: Pupils are equal, round, and reactive to light.  Neck:     Thyroid: No thyromegaly.     Vascular: No JVD.     Trachea: No tracheal deviation.  Cardiovascular:     Rate and Rhythm: Normal rate and regular rhythm.     Pulses: Intact distal pulses.  Pulmonary:     Effort:  Pulmonary effort is normal. No respiratory distress.     Breath sounds: Normal breath sounds. No wheezing.  Abdominal:     Palpations: Abdomen is soft.     Tenderness: There  is no abdominal tenderness. There is no guarding.  Musculoskeletal:     Cervical back: Neck supple.     Left hip: Tenderness and bony tenderness present. Decreased range of motion. Decreased strength.  Lymphadenopathy:     Cervical: No cervical adenopathy.  Skin:    General: Skin is warm and dry.  Neurological:     Mental Status: She is alert and oriented to person, place, and time.  Psychiatric:        Mood and Affect: Mood and affect normal.     Assessment/Plan: Left femoral neck fracture   NPO now  Plan on surgery later today per Dr. Alvan Dame  Urgent order set placed for a left total hip arthroplasty, anterior approach  Risks, benefits and expectations were discussed with the patient.  Risks including but not limited to the risk of anesthesia, blood clots, nerve damage, blood vessel damage, failure of the prosthesis, infection and up to and including death.  Patient understand the risks, benefits and expectations and wishes to proceed with surgery.     Lucille Passy [facility] 07/29/2020, 11:20 AM

## 2020-07-29 NOTE — Anesthesia Preprocedure Evaluation (Addendum)
Anesthesia Evaluation  Patient identified by MRN, date of birth, ID band Patient awake    Reviewed: Allergy & Precautions, NPO status , Patient's Chart, lab work & pertinent test results  History of Anesthesia Complications Negative for: history of anesthetic complications  Airway Mallampati: II  TM Distance: >3 FB Neck ROM: Full    Dental  (+) Teeth Intact   Pulmonary neg pulmonary ROS,    Pulmonary exam normal        Cardiovascular hypertension, Pt. on medications  Rhythm:Regular Rate:Normal     Neuro/Psych negative neurological ROS  negative psych ROS   GI/Hepatic negative GI ROS, Neg liver ROS,   Endo/Other  diabetes, Type 2, Insulin Dependent, Oral Hypoglycemic AgentsHypothyroidism   Renal/GU Renal InsufficiencyRenal disease  negative genitourinary   Musculoskeletal Left hip fracture 3/5 s/p mechanical fall   Abdominal (+)  Abdomen: soft. Bowel sounds: normal.  Peds  Hematology  (+) anemia , Hgb 9.9, plt 163k   Anesthesia Other Findings Day of surgery medications reviewed with patient.  Reproductive/Obstetrics negative OB ROS                           Anesthesia Physical Anesthesia Plan  ASA: II  Anesthesia Plan: Spinal and MAC   Post-op Pain Management:    Induction: Intravenous  PONV Risk Score and Plan: 2 and Treatment may vary due to age or medical condition, Propofol infusion and Ondansetron  Airway Management Planned: Simple Face Mask, Natural Airway and Nasal Cannula  Additional Equipment: None  Intra-op Plan:   Post-operative Plan:   Informed Consent: I have reviewed the patients History and Physical, chart, labs and discussed the procedure including the risks, benefits and alternatives for the proposed anesthesia with the patient or authorized representative who has indicated his/her understanding and acceptance.     Dental advisory given  Plan Discussed  with: CRNA  Anesthesia Plan Comments: (Lab Results      Component                Value               Date                      WBC                      7.1                 07/30/2020                HGB                      10.7 (L)            07/30/2020                HCT                      30.9 (L)            07/30/2020                MCV                      89.0                07/30/2020  PLT                      205                 07/30/2020           Lab Results      Component                Value               Date                      NA                       139                 07/30/2020                K                        3.4 (L)             07/30/2020                CO2                      24                  07/30/2020                GLUCOSE                  71                  07/30/2020                BUN                      13                  07/30/2020                CREATININE               0.97                07/30/2020                CALCIUM                  8.7 (L)             07/30/2020                GFRNONAA                 >60                 07/30/2020                GFRAA                                        08/18/2008            >60        The eGFR has been calculated using the MDRD equation. This calculation  has not been validated in all clinical situations. eGFR's persistently <60 mL/min signify possible Chronic Kidney Disease.)      Anesthesia Quick Evaluation

## 2020-07-29 NOTE — ED Notes (Signed)
Report given to chandra RN

## 2020-07-29 NOTE — Progress Notes (Signed)
PROGRESS NOTE    [name]  HCW:[id number] DOB: Aug 16, 1945 DOA: 07/28/2020 PCP: Carolee Rota, NP  Brief Narrative: 75 year old female with history of insulin-dependent diabetes mellitus, hypertension, hypothyroidism presented to the ED with severe left hip and chest wall pain after a mechanical fall at home on 3/5. -She denies any loss of consciousness she tripped in her yard and fell, hit her chest and fell on her left hip, subsequently had severe left hip pain and chest wall pain.  In the ED her vitals were stable, chest x-ray and rib films were negative for acute findings, x-ray of the left hip noted left femoral neck fracture, blood sugar was 265, potassium was 6.3, creatinine was 1.2. -Orthopedics consulted, plan for left hip arthroplasty today   Assessment & Plan:   Left hip fracture -Following mechanical fall -Orthopedics consulting, plan for arthroplasty today -PT OT eval postop -Start DVT prophylaxis after surgery  Mild renal insufficiency Hyperkalemia -Hyperkalemia has resolved, lisinopril and HCTZ on hold  Left chest wall pain -Improved, she has ecchymosis on exam -Chest x-ray and rib series without fracture or pneumothorax  Insulin-dependent diabetes mellitus -Restart Lantus at a lower dose, continue sliding scale insulin  Mild hyperbilirubinemia -Asymptomatic, trend  Hypertension -Blood pressures are stable, lisinopril and HCTZ on hold  Hypothyroidism -Continue Synthroid  DVT prophylaxis: SCDs Code Status: Full code Family Communication: Discussed with patient in detail, no family at bedside Disposition Plan:  Status is: Inpatient  Remains inpatient appropriate because:Inpatient level of care appropriate due to severity of illness   Dispo: The patient is from: Home              Anticipated d/c is to: Home vs SNF              Patient currently is not medically stable to d/c.   Difficult to place patient No   Consultants:  Orthopedics   Procedures:   Antimicrobials:    Subjective: -Complains of some left hip pain and discomfort, left chest wall pain is improving  Objective: Vitals:   07/29/20 1000 07/29/20 1030 07/29/20 1100 07/29/20 1130  BP: (!) 150/61 (!) 145/59 (!) 153/58 (!) 143/60  Pulse: 66 68 71 67  Resp: 19 14 16 16   Temp:      TempSrc:      SpO2: 96% 97% 98% 97%   No intake or output data in the 24 hours ending 07/29/20 1327 There were no vitals filed for this visit.  Examination:  General exam: Pleasant elderly female laying in bed, AAOx3 HEENT: No JVD, bruising and ecchymosis on upper chest CVS: S1-S2, regular rate rhythm Lungs: Decreased breath sounds at the bases otherwise clear Abdomen: Soft, nontender, bowel sounds present Extremities: No edema, left leg is shortened and externally rotated Psychiatry: Mood & affect appropriate.     Data Reviewed:   CBC: Recent Labs  Lab 07/28/20 2216 07/29/20 0415  WBC 8.1 7.8  NEUTROABS 6.5  --   HGB 10.0* 9.9*  HCT 30.4* 30.4*  MCV 92.7 93.0  PLT 168 176   Basic Metabolic Panel: Recent Labs  Lab 07/28/20 2111 07/28/20 2249 07/28/20 2257 07/29/20 0415  NA 133*  --   --  135  K 6.3* 4.3 4.4 4.3  CL 102  --   --  104  CO2 15*  --   --  18*  GLUCOSE 265*  --   --  331*  BUN 17  --   --  16  CREATININE 1.24*  --   --  1.22*  CALCIUM 8.7*  --   --  8.4*   GFR: CrCl cannot be calculated (Unknown ideal weight.). Liver Function Tests: Recent Labs  Lab 07/28/20 2111 07/28/20 2257 07/29/20 0415  AST 51*  --  25  ALT 15  --  16  ALKPHOS 76  --  67  BILITOT 2.7* 1.9* 1.9*  PROT 6.8  --  6.1*  ALBUMIN 3.6  --  3.2*   No results for input(s): LIPASE, AMYLASE in the last 168 hours. No results for input(s): AMMONIA in the last 168 hours. Coagulation Profile: No results for input(s): INR, PROTIME in the last 168 hours. Cardiac Enzymes: Recent Labs  Lab 07/28/20 2249  CKTOTAL 301*   BNP (last 3 results) No  results for input(s): PROBNP in the last 8760 hours. HbA1C: Recent Labs    07/28/20 2251  HGBA1C 9.5*   CBG: Recent Labs  Lab 07/29/20 0433 07/29/20 0845 07/29/20 1203  GLUCAP 303* 172* 134*   Lipid Profile: No results for input(s): CHOL, HDL, LDLCALC, TRIG, CHOLHDL, LDLDIRECT in the last 72 hours. Thyroid Function Tests: No results for input(s): TSH, T4TOTAL, FREET4, T3FREE, THYROIDAB in the last 72 hours. Anemia Panel: No results for input(s): VITAMINB12, FOLATE, FERRITIN, TIBC, IRON, RETICCTPCT in the last 72 hours. Urine analysis:    Component Value Date/Time   COLORURINE YELLOW 07/28/2020 2253   APPEARANCEUR CLEAR 07/28/2020 2253   LABSPEC 1.020 07/28/2020 2253   PHURINE 5.0 07/28/2020 2253   GLUCOSEU 50 (A) 07/28/2020 2253   HGBUR NEGATIVE 07/28/2020 2253   BILIRUBINUR NEGATIVE 07/28/2020 2253   KETONESUR 80 (A) 07/28/2020 2253   PROTEINUR NEGATIVE 07/28/2020 2253   UROBILINOGEN 1.0 08/12/2008 1518   NITRITE NEGATIVE 07/28/2020 2253   LEUKOCYTESUR TRACE (A) 07/28/2020 2253   Sepsis Labs: @LABRCNTIP (procalcitonin:4,lacticidven:4)  ) Recent Results (from the past 240 hour(s))  Resp Panel by RT-PCR (Flu A&B, Covid) Nasopharyngeal Swab     Status: None   Collection Time: 07/28/20  9:32 PM   Specimen: Nasopharyngeal Swab; Nasopharyngeal(NP) swabs in vial transport medium  Result Value Ref Range Status   SARS Coronavirus 2 by RT PCR NEGATIVE NEGATIVE Final    Comment: (NOTE) SARS-CoV-2 target nucleic acids are NOT DETECTED.  The SARS-CoV-2 RNA is generally detectable in upper respiratory specimens during the acute phase of infection. The lowest concentration of SARS-CoV-2 viral copies this assay can detect is 138 copies/mL. A negative result does not preclude SARS-Cov-2 infection and should not be used as the sole basis for treatment or other patient management decisions. A negative result may occur with  improper specimen collection/handling, submission of  specimen other than nasopharyngeal swab, presence of viral mutation(s) within the areas targeted by this assay, and inadequate number of viral copies(<138 copies/mL). A negative result must be combined with clinical observations, patient history, and epidemiological information. The expected result is Negative.  Fact Sheet for Patients:  EntrepreneurPulse.com.au  Fact Sheet for Healthcare Providers:  IncredibleEmployment.be  This test is no t yet approved or cleared by the [place] FDA and  has been authorized for detection and/or diagnosis of SARS-CoV-2 by FDA under an Emergency Use Authorization (EUA). This EUA will remain  in effect (meaning this test can be used) for the duration of the COVID-19 declaration under Section 564(b)(1) of the Act, 21 U.S.C.section 360bbb-3(b)(1), unless the authorization is terminated  or revoked sooner.       Influenza A by PCR NEGATIVE NEGATIVE Final   Influenza B by PCR NEGATIVE NEGATIVE Final  Comment: (NOTE) The Xpert Xpress SARS-CoV-2/FLU/RSV plus assay is intended as an aid in the diagnosis of influenza from Nasopharyngeal swab specimens and should not be used as a sole basis for treatment. Nasal washings and aspirates are unacceptable for Xpert Xpress SARS-CoV-2/FLU/RSV testing.  Fact Sheet for Patients: EntrepreneurPulse.com.au  Fact Sheet for Healthcare Providers: IncredibleEmployment.be  This test is not yet approved or cleared by the [place] FDA and has been authorized for detection and/or diagnosis of SARS-CoV-2 by FDA under an Emergency Use Authorization (EUA). This EUA will remain in effect (meaning this test can be used) for the duration of the COVID-19 declaration under Section 564(b)(1) of the Act, 21 U.S.C. section 360bbb-3(b)(1), unless the authorization is terminated or revoked.  Performed at [facility], [place] [address],  [place], [place] [address]     Radiology Studies: DG Chest 2 View  Result Date: 07/28/2020 CLINICAL DATA:  Post fall with hip pain.  Chest wall pain. EXAM: CHEST - 2 VIEW COMPARISON:  09/14/2017 FINDINGS: Stable heart size and mediastinal contours. Stable upper normal heart size. The lungs are clear. Pulmonary vasculature is normal. No consolidation, pleural effusion, or pneumothorax. Left ribs are better assessed on dedicated rib films, reported separately. No acute osseous abnormalities are seen. Multiple surgical clips overlie the chest wall. IMPRESSION: No acute chest findings. Electronically Signed   By: Keith Rake M.D.   On: 07/28/2020 21:04   DG Ribs Unilateral Left  Result Date: 07/28/2020 CLINICAL DATA:  Fall with chest wall pain. EXAM: LEFT RIBS - 2 VIEW COMPARISON:  Concurrent chest radiograph. FINDINGS: Cortical margins of the left ribs are intact. No fracture or other bone lesions are seen involving the ribs. IMPRESSION: No fracture of left ribs. Electronically Signed   By: Keith Rake M.D.   On: 07/28/2020 21:08   DG Knee Left Port  Result Date: 07/28/2020 CLINICAL DATA:  Left leg pain following fall, initial encounter EXAM: PORTABLE LEFT KNEE - 2 VIEW COMPARISON:  None. FINDINGS: Degenerative changes are noted in the left knee joint primarily in the patellofemoral and medial joint space. No acute fracture or dislocation is noted. No soft tissue abnormality is seen. IMPRESSION: Degenerative change without acute abnormality. Electronically Signed   By: Inez Catalina M.D.   On: 07/28/2020 23:08   DG Hip Unilat With Pelvis 2-3 Views Left  Result Date: 07/28/2020 CLINICAL DATA:  Fall EXAM: DG HIP (WITH OR WITHOUT PELVIS) 2-3V LEFT COMPARISON:  Acute abdominal series 08/10/2008 FINDINGS: There is a transcervical left femoral neck fracture with foreshortening across the fracture line. Questionable fragmentation versus enthesopathic change involving the greater trochanter as well.  Surrounding soft tissue swelling is noted. No other acute fracture or traumatic osseous injury is seen involving the bones of the pelvis or the contralateral proximal right femur. Femoral heads remain normally located. Degenerative changes in the spine, hips and pelvis. Prior right lower abdominal hernia mesh repair. Additional surgical clips in the pelvis. IMPRESSION: 1. Transcervical left femoral neck fracture with foreshortening across the fracture line. 2. Questionable fragmentation versus enthesopathic change involving the left greater trochanter as well. Electronically Signed   By: Lovena Le M.D.   On: 07/28/2020 21:09   Scheduled Meds: . atorvastatin  20 mg Oral Daily  . chlorhexidine  60 mL Topical Once  . insulin aspart  0-9 Units Subcutaneous Q4H  . insulin glargine  15 Units Subcutaneous Daily  . [START ON 07/30/2020] levothyroxine  100 mcg Oral Once per day on Mon Wed  Fri Sat  . povidone-iodine  2 application Topical Once   Continuous Infusions: .  ceFAZolin (ANCEF) IV       LOS: 1 day    Time spent: 70min  Domenic Polite, MD [facility]  07/29/2020, 1:27 PM

## 2020-07-29 NOTE — Consult Note (Signed)
Reason for Consult:  Left hip fracture Referring Physician: ED Physician  [name] is an 75 y.o. female.  HPI: She states that she fell, 3 days ago, when she slipped, while working on her pool.  After that she was unable stand up and walk but was able to get herself into her house by scooting on her bottom, and up the steps.  Family members helped her to her bed.  EMS was summoned to the home but patient was not transported at that time.  She presents today for worsening pain in the left hip region.  She also complains of pain in her left ribs, and her neck where she was injured when she fell.  She denies headache, back pain, focal weakness or paresthesia.  There are no other known active modifying factors.  Ortho consulted.  Risks, benefits and expectations were discussed with the patient.  Risks including but not limited to the risk of anesthesia, blood clots, nerve damage, blood vessel damage, failure of the prosthesis, infection and up to and including death.  Patient understand the risks, benefits and expectations and wishes to proceed with surgery.   Past Medical History:  Diagnosis Date  . Cancer [facility])    prior breast cancer 25 years ago   . Diabetes mellitus, type II, insulin dependent ([place]) 07/28/2020  . Essential hypertension 07/28/2020  . Hypothyroidism, unspecified 07/28/2020    Past Surgical History:  Procedure Laterality Date  . CHOLECYSTECTOMY    . HERNIA REPAIR    . MASTECTOMY      Family History  Problem Relation Age of Onset  . Breast cancer Mother     Social History:  reports that she has never smoked. She has never used smokeless tobacco. She reports previous alcohol use. She reports that she does not use drugs.  Allergies:  Allergies  Allergen Reactions  . Calcium     Other reaction(s): nausea  . Sulfa Antibiotics Nausea And Vomiting    Medications: I have reviewed the patient's current medications.  Results for orders placed or performed during the  hospital encounter of 07/28/20 (from the past 48 hour(s))  Comprehensive metabolic panel     Status: Abnormal   Collection Time: 07/28/20  9:11 PM  Result Value Ref Range   Sodium 133 (L) 135 - 145 mmol/L   Potassium 6.3 (HH) 3.5 - 5.1 mmol/L    Comment: CRITICAL RESULT CALLED TO, READ BACK BY AND VERIFIED WITH: SLIGHT HEMOLYSIS  D. SIDBURY RN @2232  07/28/2020 K. SANDERS    Chloride 102 98 - 111 mmol/L   CO2 15 (L) 22 - 32 mmol/L   Glucose, Bld 265 (H) 70 - 99 mg/dL    Comment: Glucose reference range applies only to samples taken after fasting for at least 8 hours.   BUN 17 8 - 23 mg/dL   Creatinine, Ser 1.24 (H) 0.44 - 1.00 mg/dL   Calcium 8.7 (L) 8.9 - 10.3 mg/dL   Total Protein 6.8 6.5 - 8.1 g/dL   Albumin 3.6 3.5 - 5.0 g/dL   AST 51 (H) 15 - 41 U/L   ALT 15 0 - 44 U/L   Alkaline Phosphatase 76 38 - 126 U/L   Total Bilirubin 2.7 (H) 0.3 - 1.2 mg/dL   GFR, Estimated 45 (L) >60 mL/min    Comment: (NOTE) Calculated using the CKD-EPI Creatinine Equation (2021)    Anion gap 16 (H) 5 - 15    Comment: Performed at [facility], [place]  [address], [place], [place] [address]  Resp Panel by RT-PCR (Flu A&B, Covid) Nasopharyngeal Swab     Status: None   Collection Time: 07/28/20  9:32 PM   Specimen: Nasopharyngeal Swab; Nasopharyngeal(NP) swabs in vial transport medium  Result Value Ref Range   SARS Coronavirus 2 by RT PCR NEGATIVE NEGATIVE    Comment: (NOTE) SARS-CoV-2 target nucleic acids are NOT DETECTED.  The SARS-CoV-2 RNA is generally detectable in upper respiratory specimens during the acute phase of infection. The lowest concentration of SARS-CoV-2 viral copies this assay can detect is 138 copies/mL. A negative result does not preclude SARS-Cov-2 infection and should not be used as the sole basis for treatment or other patient management decisions. A negative result may occur with  improper specimen collection/handling, submission of specimen other than nasopharyngeal  swab, presence of viral mutation(s) within the areas targeted by this assay, and inadequate number of viral copies(<138 copies/mL). A negative result must be combined with clinical observations, patient history, and epidemiological information. The expected result is Negative.  Fact Sheet for Patients:  EntrepreneurPulse.com.au  Fact Sheet for Healthcare Providers:  IncredibleEmployment.be  This test is no t yet approved or cleared by the [place] FDA and  has been authorized for detection and/or diagnosis of SARS-CoV-2 by FDA under an Emergency Use Authorization (EUA). This EUA will remain  in effect (meaning this test can be used) for the duration of the COVID-19 declaration under Section 564(b)(1) of the Act, 21 U.S.C.section 360bbb-3(b)(1), unless the authorization is terminated  or revoked sooner.       Influenza A by PCR NEGATIVE NEGATIVE   Influenza B by PCR NEGATIVE NEGATIVE    Comment: (NOTE) The Xpert Xpress SARS-CoV-2/FLU/RSV plus assay is intended as an aid in the diagnosis of influenza from Nasopharyngeal swab specimens and should not be used as a sole basis for treatment. Nasal washings and aspirates are unacceptable for Xpert Xpress SARS-CoV-2/FLU/RSV testing.  Fact Sheet for Patients: EntrepreneurPulse.com.au  Fact Sheet for Healthcare Providers: IncredibleEmployment.be  This test is not yet approved or cleared by the [place] FDA and has been authorized for detection and/or diagnosis of SARS-CoV-2 by FDA under an Emergency Use Authorization (EUA). This EUA will remain in effect (meaning this test can be used) for the duration of the COVID-19 declaration under Section 564(b)(1) of the Act, 21 U.S.C. section 360bbb-3(b)(1), unless the authorization is terminated or revoked.  Performed at [facility], [place] [address] [place], [place] [address]   CBC with  Differential/Platelet     Status: Abnormal   Collection Time: 07/28/20 10:16 PM  Result Value Ref Range   WBC 8.1 4.0 - 10.5 K/uL   RBC 3.28 (L) 3.87 - 5.11 MIL/uL   Hemoglobin 10.0 (L) 12.0 - 15.0 g/dL   HCT 30.4 (L) 36.0 - 46.0 %   MCV 92.7 80.0 - 100.0 fL   MCH 30.5 26.0 - 34.0 pg   MCHC 32.9 30.0 - 36.0 g/dL   RDW 12.9 11.5 - 15.5 %   Platelets 168 150 - 400 K/uL   nRBC 0.0 0.0 - 0.2 %   Neutrophils Relative % 79 %   Neutro Abs 6.5 1.7 - 7.7 K/uL   Lymphocytes Relative 10 %   Lymphs Abs 0.8 0.7 - 4.0 K/uL   Monocytes Relative 9 %   Monocytes Absolute 0.7 0.1 - 1.0 K/uL   Eosinophils Relative 1 %   Eosinophils Absolute 0.1 0.0 - 0.5 K/uL   Basophils Relative 1 %  Basophils Absolute 0.1 0.0 - 0.1 K/uL   Immature Granulocytes 0 %   Abs Immature Granulocytes 0.03 0.00 - 0.07 K/uL    Comment: Performed at [facility], [place] [address], [place], [place] [address]  ABO/Rh     Status: None   Collection Time: 07/28/20 10:16 PM  Result Value Ref Range   ABO/RH(D)      A POS Performed at [place] [address] [place], [place] [address]   CK     Status: Abnormal   Collection Time: 07/28/20 10:49 PM  Result Value Ref Range   Total CK 301 (H) 38 - 234 U/L    Comment: Performed at [facility], [place] [address] [place], [place] [address]  Potassium     Status: None   Collection Time: 07/28/20 10:49 PM  Result Value Ref Range   Potassium 4.3 3.5 - 5.1 mmol/L    Comment: DELTA CHECK NOTED Performed at [facility], [place] [address] [place], [place] [address]   Hemoglobin A1c     Status: Abnormal   Collection Time: 07/28/20 10:51 PM  Result Value Ref Range   Hgb A1c MFr Bld 9.5 (H) 4.8 - 5.6 %    Comment: (NOTE) Pre diabetes:          5.7%-6.4%  Diabetes:              >6.4%  Glycemic control for   <7.0% adults with diabetes    Mean Plasma Glucose 225.95 mg/dL    Comment: Performed at [place] [address] [place], [place]  [address]  Urinalysis, Complete w Microscopic     Status: Abnormal   Collection Time: 07/28/20 10:53 PM  Result Value Ref Range   Color, Urine YELLOW YELLOW   APPearance CLEAR CLEAR   Specific Gravity, Urine 1.020 1.005 - 1.030   pH 5.0 5.0 - 8.0   Glucose, UA 50 (A) NEGATIVE mg/dL   Hgb urine dipstick NEGATIVE NEGATIVE   Bilirubin Urine NEGATIVE NEGATIVE   Ketones, ur 80 (A) NEGATIVE mg/dL   Protein, ur NEGATIVE NEGATIVE mg/dL   Nitrite NEGATIVE NEGATIVE   Leukocytes,Ua TRACE (A) NEGATIVE   RBC / HPF 0-5 0 - 5 RBC/hpf   WBC, UA 21-50 0 - 5 WBC/hpf   Bacteria, UA NONE SEEN NONE SEEN    Comment: Performed at [facility] [address], [place], [place] [address]  Bilirubin, fractionated(tot/dir/indir)     Status: Abnormal   Collection Time: 07/28/20 10:57 PM  Result Value Ref Range   Total Bilirubin 1.9 (H) 0.3 - 1.2 mg/dL   Bilirubin, Direct 0.4 (H) 0.0 - 0.2 mg/dL   Indirect Bilirubin 1.5 (H) 0.3 - 0.9 mg/dL    Comment: Performed at [facility] [address], [place], [place] [address]  Potassium     Status: None   Collection Time: 07/28/20 10:57 PM  Result Value Ref Range   Potassium 4.4 3.5 - 5.1 mmol/L    Comment: Performed at [facility], [place] [address], [place], [place] [address]  Type and screen [place]     Status: None   Collection Time: 07/28/20 11:37 PM  Result Value Ref Range   ABO/RH(D) A POS    Antibody Screen NEG    Sample Expiration      07/31/2020,2359 Performed at [facility], [place] [address], [place], [place] [address]   CBC  Status: Abnormal   Collection Time: 07/29/20  4:15 AM  Result Value Ref Range   WBC 7.8 4.0 - 10.5 K/uL   RBC 3.27 (L) 3.87 - 5.11 MIL/uL   Hemoglobin 9.9 (L) 12.0 - 15.0 g/dL   HCT 30.4 (L) 36.0 - 46.0 %   MCV 93.0 80.0 - 100.0 fL   MCH 30.3 26.0 - 34.0 pg   MCHC 32.6 30.0 - 36.0 g/dL   RDW 13.0 11.5 - 15.5 %   Platelets 163 150 - 400 K/uL   nRBC 0.0 0.0 - 0.2 %    Comment:  Performed at [facility], [place] [address], [place], [place] [address]  VITAMIN D 25 Hydroxy (Vit-D Deficiency, Fractures)     Status: Abnormal   Collection Time: 07/29/20  4:15 AM  Result Value Ref Range   Vit D, 25-Hydroxy 17.84 (L) 30 - 100 ng/mL    Comment: (NOTE) Vitamin D deficiency has been defined by the Institute of Medicine  and an Endocrine Society practice guideline as a level of serum 25-OH  vitamin D less than 20 ng/mL (1,2). The Endocrine Society went on to  further define vitamin D insufficiency as a level between 21 and 29  ng/mL (2).  1. IOM (Institute of Medicine). 2010. Dietary reference intakes for  calcium and D. [place]: The [employer]. 2. Holick MF, Binkley [place], Bischoff-Ferrari HA, et al. Evaluation,  treatment, and prevention of vitamin D deficiency: an Endocrine  Society clinical practice guideline, JCEM. 2011 Jul; 96(7): 1911-30.  Performed at [facility], [place] [address] [place], [place] [address]   Comprehensive metabolic panel     Status: Abnormal   Collection Time: 07/29/20  4:15 AM  Result Value Ref Range   Sodium 135 135 - 145 mmol/L   Potassium 4.3 3.5 - 5.1 mmol/L   Chloride 104 98 - 111 mmol/L   CO2 18 (L) 22 - 32 mmol/L   Glucose, Bld 331 (H) 70 - 99 mg/dL    Comment: Glucose reference range applies only to samples taken after fasting for at least 8 hours.   BUN 16 8 - 23 mg/dL   Creatinine, Ser 1.22 (H) 0.44 - 1.00 mg/dL   Calcium 8.4 (L) 8.9 - 10.3 mg/dL   Total Protein 6.1 (L) 6.5 - 8.1 g/dL   Albumin 3.2 (L) 3.5 - 5.0 g/dL   AST 25 15 - 41 U/L   ALT 16 0 - 44 U/L   Alkaline Phosphatase 67 38 - 126 U/L   Total Bilirubin 1.9 (H) 0.3 - 1.2 mg/dL   GFR, Estimated 46 (L) >60 mL/min    Comment: (NOTE) Calculated using the CKD-EPI Creatinine Equation (2021)    Anion gap 13 5 - 15    Comment: Performed at [facility], [place] [address] [place], [place] [address]  CBG monitoring, ED     Status: Abnormal    Collection Time: 07/29/20  4:33 AM  Result Value Ref Range   Glucose-Capillary 303 (H) 70 - 99 mg/dL    Comment: Glucose reference range applies only to samples taken after fasting for at least 8 hours.  CBG monitoring, ED     Status: Abnormal   Collection Time: 07/29/20  8:45 AM  Result Value Ref Range   Glucose-Capillary 172 (H) 70 - 99 mg/dL    Comment: Glucose reference range applies only to samples taken after fasting for at least 8 hours.    DG Chest 2  View  Result Date: 07/28/2020 CLINICAL DATA:  Post fall with hip pain.  Chest wall pain. EXAM: CHEST - 2 VIEW COMPARISON:  09/14/2017 FINDINGS: Stable heart size and mediastinal contours. Stable upper normal heart size. The lungs are clear. Pulmonary vasculature is normal. No consolidation, pleural effusion, or pneumothorax. Left ribs are better assessed on dedicated rib films, reported separately. No acute osseous abnormalities are seen. Multiple surgical clips overlie the chest wall. IMPRESSION: No acute chest findings. Electronically Signed   By: Keith Rake M.D.   On: 07/28/2020 21:04   DG Ribs Unilateral Left  Result Date: 07/28/2020 CLINICAL DATA:  Fall with chest wall pain. EXAM: LEFT RIBS - 2 VIEW COMPARISON:  Concurrent chest radiograph. FINDINGS: Cortical margins of the left ribs are intact. No fracture or other bone lesions are seen involving the ribs. IMPRESSION: No fracture of left ribs. Electronically Signed   By: Keith Rake M.D.   On: 07/28/2020 21:08   DG Knee Left Port  Result Date: 07/28/2020 CLINICAL DATA:  Left leg pain following fall, initial encounter EXAM: PORTABLE LEFT KNEE - 2 VIEW COMPARISON:  None. FINDINGS: Degenerative changes are noted in the left knee joint primarily in the patellofemoral and medial joint space. No acute fracture or dislocation is noted. No soft tissue abnormality is seen. IMPRESSION: Degenerative change without acute abnormality. Electronically Signed   By: Inez Catalina M.D.   On:  07/28/2020 23:08   DG Hip Unilat With Pelvis 2-3 Views Left  Result Date: 07/28/2020 CLINICAL DATA:  Fall EXAM: DG HIP (WITH OR WITHOUT PELVIS) 2-3V LEFT COMPARISON:  Acute abdominal series 08/10/2008 FINDINGS: There is a transcervical left femoral neck fracture with foreshortening across the fracture line. Questionable fragmentation versus enthesopathic change involving the greater trochanter as well. Surrounding soft tissue swelling is noted. No other acute fracture or traumatic osseous injury is seen involving the bones of the pelvis or the contralateral proximal right femur. Femoral heads remain normally located. Degenerative changes in the spine, hips and pelvis. Prior right lower abdominal hernia mesh repair. Additional surgical clips in the pelvis. IMPRESSION: 1. Transcervical left femoral neck fracture with foreshortening across the fracture line. 2. Questionable fragmentation versus enthesopathic change involving the left greater trochanter as well. Electronically Signed   By: Lovena Le M.D.   On: 07/28/2020 21:09    Review of Systems  Constitutional: Negative.   HENT: Negative.   Eyes: Negative.   Respiratory: Negative.   Cardiovascular: Negative.   Gastrointestinal: Negative.   Genitourinary: Negative.   Musculoskeletal: Positive for joint pain.  Skin: Negative.   Neurological: Negative.   Endo/Heme/Allergies: Negative.   Psychiatric/Behavioral: Negative.     Blood pressure (!) 145/59, pulse 68, temperature 98.2 F (36.8 C), temperature source Oral, resp. rate 14, SpO2 97 %. Physical Exam Constitutional:      Appearance: She is well-developed.  HENT:     Head: Normocephalic.  Eyes:     Pupils: Pupils are equal, round, and reactive to light.  Neck:     Thyroid: No thyromegaly.     Vascular: No JVD.     Trachea: No tracheal deviation.  Cardiovascular:     Rate and Rhythm: Normal rate and regular rhythm.     Pulses: Intact distal pulses.  Pulmonary:     Effort:  Pulmonary effort is normal. No respiratory distress.     Breath sounds: Normal breath sounds. No wheezing.  Abdominal:     Palpations: Abdomen is soft.     Tenderness: There  is no abdominal tenderness. There is no guarding.  Musculoskeletal:     Cervical back: Neck supple.     Left hip: Tenderness and bony tenderness present. Decreased range of motion. Decreased strength.  Lymphadenopathy:     Cervical: No cervical adenopathy.  Skin:    General: Skin is warm and dry.  Neurological:     Mental Status: She is alert and oriented to person, place, and time.  Psychiatric:        Mood and Affect: Mood and affect normal.     Assessment/Plan: Left femoral neck fracture   NPO now  Plan on surgery later today per Dr. Alvan Dame  Urgent order set placed for a left total hip arthroplasty, anterior approach  Risks, benefits and expectations were discussed with the patient.  Risks including but not limited to the risk of anesthesia, blood clots, nerve damage, blood vessel damage, failure of the prosthesis, infection and up to and including death.  Patient understand the risks, benefits and expectations and wishes to proceed with surgery.     Lucille Passy [facility] 07/29/2020, 11:20 AM

## 2020-07-29 NOTE — Progress Notes (Signed)
Orthopaedic Trauma Service   Ortho aware of pt We had a level 1 trauma pt come in this am with complex pelvic trauma Have discussed with Dr. Alvan Dame and he will assume management for [name] regarding her ortho injuries  Please keep NPO in the event he is able to complete her surgery today   Jari Pigg, PA-C [phone] (C) 07/29/2020, 9:12 AM  [facility] [place] [phone] (F)

## 2020-07-30 ENCOUNTER — Inpatient Hospital Stay (HOSPITAL_COMMUNITY): Payer: Medicare Other | Admitting: Anesthesiology

## 2020-07-30 ENCOUNTER — Inpatient Hospital Stay (HOSPITAL_COMMUNITY): Payer: Medicare Other

## 2020-07-30 ENCOUNTER — Encounter (HOSPITAL_COMMUNITY): Payer: Self-pay | Admitting: Family Medicine

## 2020-07-30 ENCOUNTER — Encounter (HOSPITAL_COMMUNITY)
Admission: EM | Disposition: A | Discharge status: Skilled Nursing Facility | Payer: Self-pay | Source: Home / Self Care | Attending: Internal Medicine

## 2020-07-30 DIAGNOSIS — S72002A Fracture of unspecified part of neck of left femur, initial encounter for closed fracture: Secondary | ICD-10-CM | POA: Diagnosis not present

## 2020-07-30 HISTORY — PX: TOTAL HIP ARTHROPLASTY: SHX124

## 2020-07-30 LAB — BASIC METABOLIC PANEL
Anion gap: 10 (ref 5–15)
BUN: 13 mg/dL (ref 8–23)
CO2: 24 mmol/L (ref 22–32)
Calcium: 8.7 mg/dL — ABNORMAL LOW (ref 8.9–10.3)
Chloride: 105 mmol/L (ref 98–111)
Creatinine, Ser: 0.97 mg/dL (ref 0.44–1.00)
GFR, Estimated: >60 mL/min (ref >60)
Glucose, Bld: 71 mg/dL (ref 70–99)
Potassium: 3.4 mmol/L — ABNORMAL LOW (ref 3.5–5.1)
Sodium: 139 mmol/L (ref 135–145)

## 2020-07-30 LAB — CBC
HCT: 30.9 % — ABNORMAL LOW (ref 36.0–46.0)
Hemoglobin: 10.7 g/dL — ABNORMAL LOW (ref 12.0–15.0)
MCH: 30.8 pg (ref 26.0–34.0)
MCHC: 34.6 g/dL (ref 30.0–36.0)
MCV: 89 fL (ref 80.0–100.0)
Platelets: 205 10*3/uL (ref 150–400)
RBC: 3.47 MIL/uL — ABNORMAL LOW (ref 3.87–5.11)
RDW: 13.1 % (ref 11.5–15.5)
WBC: 7.1 10*3/uL (ref 4.0–10.5)
nRBC: 0 % (ref 0.0–0.2)

## 2020-07-30 LAB — GLUCOSE, CAPILLARY
Glucose-Capillary: 69 mg/dL — ABNORMAL LOW (ref 70–99)
Glucose-Capillary: 140 mg/dL — ABNORMAL HIGH (ref 70–99)
Glucose-Capillary: 140 mg/dL — ABNORMAL HIGH (ref 70–99)
Glucose-Capillary: 131 mg/dL — ABNORMAL HIGH (ref 70–99)
Glucose-Capillary: 137 mg/dL — ABNORMAL HIGH (ref 70–99)
Glucose-Capillary: 138 mg/dL — ABNORMAL HIGH (ref 70–99)

## 2020-07-30 LAB — SURGICAL PCR SCREEN
MRSA, PCR: NEGATIVE
Staphylococcus aureus: NEGATIVE

## 2020-07-30 SURGERY — HEMIARTHROPLASTY, HIP, DIRECT ANTERIOR APPROACH, FOR FRACTURE
Anesthesia: General | Site: Hip | Laterality: Left

## 2020-07-30 SURGERY — ARTHROPLASTY, HIP, TOTAL, ANTERIOR APPROACH
Anesthesia: Monitor Anesthesia Care | Site: Hip | Laterality: Left

## 2020-07-30 MED ORDER — PHENOL 1.4 % MT LIQD: 1.0000 | OROMUCOSAL | Status: DC | PRN

## 2020-07-30 MED ORDER — VANCOMYCIN HCL 1000 MG IV SOLR
INTRAVENOUS | Status: AC
  Filled 2020-07-30: qty 1000

## 2020-07-30 MED ORDER — ADULT MULTIVITAMIN W/MINERALS CH
1.0000 | ORAL_TABLET | Freq: Every day | ORAL | Status: DC
  Administered 2020-07-31 – 2020-08-04 (×5): 1 via ORAL
  Filled 2020-07-30 (×5): qty 1

## 2020-07-30 MED ORDER — BUPIVACAINE IN DEXTROSE 0.75-8.25 % IT SOLN
INTRATHECAL | Status: DC | PRN
  Administered 2020-07-30: 1.6 mL via INTRATHECAL

## 2020-07-30 MED ORDER — ASPIRIN 81 MG PO CHEW
81.0000 mg | CHEWABLE_TABLET | Freq: Two times a day (BID) | ORAL | Status: DC
  Administered 2020-07-30 – 2020-08-04 (×10): 81 mg via ORAL
  Filled 2020-07-30 (×11): qty 1

## 2020-07-30 MED ORDER — FENTANYL CITRATE (PF) 100 MCG/2ML IJ SOLN
INTRAMUSCULAR | Status: DC | PRN
  Administered 2020-07-30: 50 ug via INTRAVENOUS

## 2020-07-30 MED ORDER — ONDANSETRON HCL 4 MG/2ML IJ SOLN
INTRAMUSCULAR | Status: AC
  Filled 2020-07-30: qty 2

## 2020-07-30 MED ORDER — MAGNESIUM CITRATE PO SOLN: 1.0000 | Freq: Once | ORAL | Status: DC | PRN

## 2020-07-30 MED ORDER — FERROUS SULFATE 325 (65 FE) MG PO TABS
325.0000 mg | ORAL_TABLET | Freq: Three times a day (TID) | ORAL | Status: DC
  Administered 2020-07-31 – 2020-08-04 (×15): 325 mg via ORAL
  Filled 2020-07-30 (×14): qty 1

## 2020-07-30 MED ORDER — CHLORHEXIDINE GLUCONATE 0.12 % MT SOLN: 15.0000 mL | Freq: Once | OROMUCOSAL | Status: AC

## 2020-07-30 MED ORDER — VITAMIN D (ERGOCALCIFEROL) 1.25 MG (50000 UNIT) PO CAPS
50000.0000 [IU] | ORAL_CAPSULE | ORAL | Status: DC
  Administered 2020-07-31: 50000 [IU] via ORAL
  Filled 2020-07-30: qty 1

## 2020-07-30 MED ORDER — BISACODYL 10 MG RE SUPP: 10.0000 mg | Freq: Every day | RECTAL | Status: DC | PRN

## 2020-07-30 MED ORDER — METOCLOPRAMIDE HCL 5 MG/ML IJ SOLN
5.0000 mg | Freq: Three times a day (TID) | INTRAMUSCULAR | Status: DC | PRN
Start: 2020-07-30 — End: 2020-07-31

## 2020-07-30 MED ORDER — OXYCODONE HCL 5 MG PO TABS: 5.0000 mg | ORAL_TABLET | Freq: Once | ORAL | Status: DC | PRN

## 2020-07-30 MED ORDER — MENTHOL 3 MG MT LOZG: 1.0000 | LOZENGE | OROMUCOSAL | Status: DC | PRN

## 2020-07-30 MED ORDER — PROPOFOL 500 MG/50ML IV EMUL
INTRAVENOUS | Status: DC | PRN
  Administered 2020-07-30: 50 ug/kg/min via INTRAVENOUS

## 2020-07-30 MED ORDER — CEFAZOLIN SODIUM-DEXTROSE 2-4 GM/100ML-% IV SOLN
2.0000 g | Freq: Four times a day (QID) | INTRAVENOUS | Status: AC
  Administered 2020-07-31 (×2): 2 g via INTRAVENOUS
  Filled 2020-07-30 (×2): qty 100

## 2020-07-30 MED ORDER — PHENYLEPHRINE 40 MCG/ML (10ML) SYRINGE FOR IV PUSH (FOR BLOOD PRESSURE SUPPORT)
PREFILLED_SYRINGE | INTRAVENOUS | Status: DC | PRN
  Administered 2020-07-30 (×2): 80 ug via INTRAVENOUS
  Administered 2020-07-30: 40 ug via INTRAVENOUS
  Administered 2020-07-30: 80 ug via INTRAVENOUS

## 2020-07-30 MED ORDER — ENSURE ENLIVE PO LIQD: 237.0000 mL | Freq: Two times a day (BID) | ORAL | Status: DC

## 2020-07-30 MED ORDER — PROPOFOL 10 MG/ML IV BOLUS
INTRAVENOUS | Status: DC | PRN
  Administered 2020-07-30 (×2): 20 mg via INTRAVENOUS
  Administered 2020-07-30: 10 mg via INTRAVENOUS

## 2020-07-30 MED ORDER — TRANEXAMIC ACID-NACL 1000-0.7 MG/100ML-% IV SOLN
1000.0000 mg | INTRAVENOUS | Status: AC
  Administered 2020-07-30: 1000 mg via INTRAVENOUS
  Filled 2020-07-30: qty 100

## 2020-07-30 MED ORDER — ALUM & MAG HYDROXIDE-SIMETH 200-200-20 MG/5ML PO SUSP: 15.0000 mL | ORAL | Status: DC | PRN

## 2020-07-30 MED ORDER — OXYCODONE HCL 5 MG/5ML PO SOLN: 5.0000 mg | Freq: Once | ORAL | Status: DC | PRN

## 2020-07-30 MED ORDER — TRANEXAMIC ACID-NACL 1000-0.7 MG/100ML-% IV SOLN
1000.0000 mg | Freq: Once | INTRAVENOUS | Status: AC
  Administered 2020-07-30: 1000 mg via INTRAVENOUS
  Filled 2020-07-30: qty 100

## 2020-07-30 MED ORDER — FENTANYL CITRATE (PF) 100 MCG/2ML IJ SOLN: 25.0000 ug | INTRAMUSCULAR | Status: DC | PRN

## 2020-07-30 MED ORDER — PROPOFOL 1000 MG/100ML IV EMUL
INTRAVENOUS | Status: AC
  Filled 2020-07-30: qty 100

## 2020-07-30 MED ORDER — PROPOFOL 10 MG/ML IV BOLUS
INTRAVENOUS | Status: AC
  Filled 2020-07-30: qty 20

## 2020-07-30 MED ORDER — DOCUSATE SODIUM 100 MG PO CAPS
100.0000 mg | ORAL_CAPSULE | Freq: Two times a day (BID) | ORAL | Status: DC
  Administered 2020-07-30 – 2020-07-31 (×2): 100 mg via ORAL
  Filled 2020-07-30 (×2): qty 1

## 2020-07-30 MED ORDER — DEXAMETHASONE SODIUM PHOSPHATE 10 MG/ML IJ SOLN: 10.0000 mg | Freq: Once | INTRAMUSCULAR | Status: DC

## 2020-07-30 MED ORDER — CHLORHEXIDINE GLUCONATE CLOTH 2 % EX PADS
6.0000 | MEDICATED_PAD | Freq: Every day | CUTANEOUS | Status: DC
  Administered 2020-07-30 – 2020-08-03 (×5): 6 via TOPICAL

## 2020-07-30 MED ORDER — FENTANYL CITRATE (PF) 250 MCG/5ML IJ SOLN
INTRAMUSCULAR | Status: AC
  Filled 2020-07-30: qty 5

## 2020-07-30 MED ORDER — ONDANSETRON HCL 4 MG/2ML IJ SOLN
INTRAMUSCULAR | Status: DC | PRN
  Administered 2020-07-30: 4 mg via INTRAVENOUS

## 2020-07-30 MED ORDER — METOCLOPRAMIDE HCL 5 MG PO TABS: 5.0000 mg | ORAL_TABLET | Freq: Three times a day (TID) | ORAL | Status: DC | PRN

## 2020-07-30 MED ORDER — POLYETHYLENE GLYCOL 3350 17 G PO PACK
17.0000 g | PACK | Freq: Two times a day (BID) | ORAL | Status: DC
  Administered 2020-07-31 – 2020-08-03 (×4): 17 g via ORAL
  Filled 2020-07-30 (×9): qty 1

## 2020-07-30 MED ORDER — SODIUM CHLORIDE 0.9 % IV SOLN: INTRAVENOUS | Status: DC

## 2020-07-30 MED ORDER — CHLORHEXIDINE GLUCONATE 0.12 % MT SOLN
OROMUCOSAL | Status: AC
  Administered 2020-07-30: 15 mL via OROMUCOSAL
  Filled 2020-07-30: qty 15

## 2020-07-30 MED ORDER — METHOCARBAMOL 500 MG PO TABS
500.0000 mg | ORAL_TABLET | Freq: Four times a day (QID) | ORAL | Status: DC | PRN
  Administered 2020-07-31: 500 mg via ORAL
  Filled 2020-07-30: qty 1

## 2020-07-30 MED ORDER — EPHEDRINE SULFATE-NACL 50-0.9 MG/10ML-% IV SOSY
PREFILLED_SYRINGE | INTRAVENOUS | Status: DC | PRN
  Administered 2020-07-30: 5 mg via INTRAVENOUS
  Administered 2020-07-30: 10 mg via INTRAVENOUS

## 2020-07-30 MED ORDER — ONDANSETRON HCL 4 MG PO TABS: 4.0000 mg | ORAL_TABLET | Freq: Four times a day (QID) | ORAL | Status: DC | PRN

## 2020-07-30 MED ORDER — DIPHENHYDRAMINE HCL 12.5 MG/5ML PO ELIX
12.5000 mg | ORAL_SOLUTION | ORAL | Status: DC | PRN
Start: 2020-07-30 — End: 2020-08-05

## 2020-07-30 MED ORDER — ONDANSETRON HCL 4 MG/2ML IJ SOLN
4.0000 mg | Freq: Four times a day (QID) | INTRAMUSCULAR | Status: DC | PRN
  Administered 2020-07-31: 4 mg via INTRAVENOUS
  Filled 2020-07-30: qty 2

## 2020-07-30 MED ORDER — CEFAZOLIN SODIUM-DEXTROSE 2-4 GM/100ML-% IV SOLN
2.0000 g | INTRAVENOUS | Status: AC
Start: 2020-07-30 — End: 2020-07-30
  Administered 2020-07-30: 2 g via INTRAVENOUS
  Filled 2020-07-30: qty 100

## 2020-07-30 MED ORDER — METHOCARBAMOL 1000 MG/10ML IJ SOLN
500.0000 mg | Freq: Four times a day (QID) | INTRAVENOUS | Status: DC | PRN
  Filled 2020-07-30: qty 5

## 2020-07-30 MED ORDER — PROMETHAZINE HCL 25 MG/ML IJ SOLN: 6.2500 mg | INTRAMUSCULAR | Status: DC | PRN

## 2020-07-30 MED ORDER — ACETAMINOPHEN 500 MG PO TABS
1000.0000 mg | ORAL_TABLET | Freq: Once | ORAL | Status: AC
  Administered 2020-07-30: 1000 mg via ORAL
  Filled 2020-07-30: qty 2

## 2020-07-30 MED ORDER — GLUCERNA SHAKE PO LIQD
237.0000 mL | Freq: Three times a day (TID) | ORAL | Status: DC
  Administered 2020-07-31 – 2020-08-02 (×3): 237 mL via ORAL

## 2020-07-30 MED ORDER — PHENYLEPHRINE HCL-NACL 10-0.9 MG/250ML-% IV SOLN
INTRAVENOUS | Status: DC | PRN
  Administered 2020-07-30: 50 ug/min via INTRAVENOUS

## 2020-07-30 SURGICAL SUPPLY — 66 items
ARTICULEZE HEAD (Hips) ×2 IMPLANT
BLADE CLIPPER SURG (BLADE) IMPLANT
BLADE SAW SGTL 18X1.27X75 (BLADE) ×2 IMPLANT
CELLS DAT CNTRL 66122 CELL SVR (MISCELLANEOUS) ×1 IMPLANT
COVER BACK TABLE 24X17X13 BIG (DRAPES) IMPLANT
COVER SURGICAL LIGHT HANDLE (MISCELLANEOUS) ×2 IMPLANT
COVER WAND RF STERILE (DRAPES) ×2 IMPLANT
CUP ACETBLR 52 OD PINNACLE (Hips) ×2 IMPLANT
DERMABOND ADVANCED (GAUZE/BANDAGES/DRESSINGS) ×1
DERMABOND ADVANCED .7 DNX12 (GAUZE/BANDAGES/DRESSINGS) ×1 IMPLANT
DRAPE C-ARM 42X72 X-RAY (DRAPES) ×2 IMPLANT
DRAPE IMP U-DRAPE 54X76 (DRAPES) ×2 IMPLANT
DRAPE STERI IOBAN 125X83 (DRAPES) ×2 IMPLANT
DRAPE U-SHAPE 47X51 STRL (DRAPES) ×6 IMPLANT
DRSG AQUACEL AG ADV 3.5X10 (GAUZE/BANDAGES/DRESSINGS) ×2 IMPLANT
DRSG TEGADERM 4X4.75 (GAUZE/BANDAGES/DRESSINGS) IMPLANT
DURAPREP 26ML APPLICATOR (WOUND CARE) ×2 IMPLANT
ELECT BLADE TIP CTD 4 INCH (ELECTRODE) IMPLANT
ELECT REM PT RETURN 9FT ADLT (ELECTROSURGICAL) ×2
ELECTRODE REM PT RTRN 9FT ADLT (ELECTROSURGICAL) ×1 IMPLANT
ELIMINATOR HOLE APEX DEPUY (Hips) ×2 IMPLANT
EVACUATOR 1/8 PVC DRAIN (DRAIN) IMPLANT
FACESHIELD WRAPAROUND (MASK) ×4 IMPLANT
GAUZE SPONGE 2X2 8PLY STRL LF (GAUZE/BANDAGES/DRESSINGS) IMPLANT
GLOVE BIOGEL PI IND STRL 7.5 (GLOVE) ×1 IMPLANT
GLOVE BIOGEL PI INDICATOR 7.5 (GLOVE) ×1
GLOVE ECLIPSE 8.0 STRL XLNG CF (GLOVE) ×4 IMPLANT
GLOVE ORTHO TXT STRL SZ7.5 (GLOVE) ×2 IMPLANT
GLOVE SRG 8 PF TXTR STRL LF DI (GLOVE) ×1 IMPLANT
GLOVE SURG UNDER POLY LF SZ8 (GLOVE) ×2
GOWN BRE IMP PREV XXLGXLNG (GOWN DISPOSABLE) ×2 IMPLANT
GOWN STRL REIN 3XL XLG LVL4 (GOWN DISPOSABLE) ×2 IMPLANT
GOWN STRL REUS W/ TWL LRG LVL3 (GOWN DISPOSABLE) ×2 IMPLANT
GOWN STRL REUS W/TWL LRG LVL3 (GOWN DISPOSABLE) ×4
HEAD ARTICULEZE (Hips) ×1 IMPLANT
KIT BASIN OR (CUSTOM PROCEDURE TRAY) ×2 IMPLANT
KIT TURNOVER KIT B (KITS) ×2 IMPLANT
LINER NEUTRAL 52X36MM PLUS 4 (Liner) ×2 IMPLANT
MANIFOLD NEPTUNE II (INSTRUMENTS) ×2 IMPLANT
NS IRRIG 1000ML POUR BTL (IV SOLUTION) ×2 IMPLANT
PACK TOTAL JOINT (CUSTOM PROCEDURE TRAY) ×2 IMPLANT
PACK UNIVERSAL I (CUSTOM PROCEDURE TRAY) ×2 IMPLANT
PAD ARMBOARD 7.5X6 YLW CONV (MISCELLANEOUS) ×4 IMPLANT
RESTRAINT LIMB HOLDER UNIV (RESTRAINTS) ×2 IMPLANT
RTRCTR WOUND ALEXIS 18CM MED (MISCELLANEOUS) ×2
SCREW 6.5MMX35MM (Screw) ×2 IMPLANT
SCREW PINN CAN BONE 6.5MMX15MM (Screw) ×2 IMPLANT
SPONGE GAUZE 2X2 STER 10/PKG (GAUZE/BANDAGES/DRESSINGS)
SPONGE LAP 4X18 RFD (DISPOSABLE) IMPLANT
STAPLER VISISTAT 35W (STAPLE) ×2 IMPLANT
STEM FEM ACTIS HIGH SZ7 (Stem) ×2 IMPLANT
SUCTION FRAZIER HANDLE 10FR (MISCELLANEOUS) ×2
SUCTION TUBE FRAZIER 10FR DISP (MISCELLANEOUS) ×1 IMPLANT
SUT MNCRL AB 4-0 PS2 18 (SUTURE) IMPLANT
SUT MON AB 4-0 PS1 27 (SUTURE) ×2 IMPLANT
SUT VIC AB 1 CT1 27 (SUTURE) ×4
SUT VIC AB 1 CT1 27XBRD ANBCTR (SUTURE) ×2 IMPLANT
SUT VIC AB 2-0 CT1 27 (SUTURE) ×4
SUT VIC AB 2-0 CT1 36 (SUTURE) IMPLANT
SUT VIC AB 2-0 CT1 TAPERPNT 27 (SUTURE) ×2 IMPLANT
SUT VLOC 180 0 24IN GS25 (SUTURE) IMPLANT
TOWEL GREEN STERILE (TOWEL DISPOSABLE) ×2 IMPLANT
TOWEL GREEN STERILE FF (TOWEL DISPOSABLE) ×2 IMPLANT
TRAY CATH 16FR W/PLASTIC CATH (SET/KITS/TRAYS/PACK) IMPLANT
TRAY FOLEY MTR SLVR 16FR STAT (SET/KITS/TRAYS/PACK) IMPLANT
WATER STERILE IRR 1000ML POUR (IV SOLUTION) ×6 IMPLANT

## 2020-07-30 NOTE — Transfer of Care (Signed)
Immediate Anesthesia Transfer of Care Note  Patient: [name]  Procedure(s) Performed: TOTAL HIP ARTHROPLASTY ANTERIOR APPROACH (Left Hip)  Patient Location: PACU  Anesthesia Type:Spinal  Level of Consciousness: awake and alert   Airway & Oxygen Therapy: Patient Spontanous Breathing  Post-op Assessment: Report given to RN and Post -op Vital signs reviewed and stable  Post vital signs: Reviewed and stable  Last Vitals:  Vitals Value Taken Time  BP 98/50 07/30/20 1923  Temp 36.1 C 07/30/20 1923  Pulse 83 07/30/20 1926  Resp 16 07/30/20 1926  SpO2 100 % 07/30/20 1926  Vitals shown include unvalidated device data.  Last Pain:  Vitals:   07/30/20 1515  TempSrc:   PainSc: 2       Patients Stated Pain Goal: 3 (16/57/90 3833)  Complications: No complications documented.

## 2020-07-30 NOTE — Progress Notes (Signed)
Patient ID: [name], female   DOB: [birth year], 75 y.o.   MRN: [id number] I was unable to get her to the OR yesterday or last night due to challenges with OR availability and time. Allowed her to eat and now NPO for plans to treat later today Planning on THR due to age, health and activity level

## 2020-07-30 NOTE — Op Note (Signed)
NAME:  [name]                ACCOUNT NO.: [id number]      MEDICAL RECORD NO.: [id number]      FACILITY:  [facility]      PHYSICIAN:  Mauri Pole  DATE OF BIRTH:  May 14, 1946     DATE OF PROCEDURE:  07/30/2020                                 OPERATIVE REPORT         PREOPERATIVE DIAGNOSIS: Left hip femoral neck fracture.      POSTOPERATIVE DIAGNOSIS:  Left hip femoral neck fracture.Marland Kitchen      PROCEDURE:  Left total hip replacement through an anterior approach   utilizing DePuy THR system, component size 52 mm pinnacle cup, a size 36+4 neutral   Altrex liner, a size 7 Hi Tri Lock stem with a 36+5 Articuleze metal head ball.      SURGEON:  Pietro Cassis. Alvan Dame, M.D.      ASSISTANT:  Danae Orleans, PA-C     ANESTHESIA:  Spinal.      SPECIMENS:  None.      COMPLICATIONS:  None.      BLOOD LOSS:  150 cc     DRAINS:  None.      INDICATION OF THE PROCEDURE:  [name] is a 75 y.o. female who presented to the ER after a ground level fall.  She had immediate onset of pain in the left hip.  Radiographs revealed a displaced left femoral neck fracture.  She was admitted to the Hospitalist service and Orthopaedics was consulted for management. Treatment options were reviewed including hemi versus total hip arthroplasty.  Pros and cons and risks and benefits were discussed.  We planned based on her age, health and desired activity to proceed with a total hip replacement.  Consent was obtained for benefit of pain relief.  Specific risks of infection, DVT, component failure, dislocation, neurovascular injury.    PROCEDURE IN DETAIL:  The patient was brought to operative theater.   Once adequate anesthesia, preoperative antibiotics, 2 gm of Ancef, 1 gm of Tranexamic Acid, and 10 mg of Decadron were administered, the patient was positioned supine on the [employer] table.  Once the patient was safely positioned with adequate padding of boney prominences we predraped out  the hip, and used fluoroscopy to confirm orientation of the pelvis.      The left hip was then prepped and draped from proximal iliac crest to   mid thigh with a shower curtain technique.      Time-out was performed identifying the patient, planned procedure, and the appropriate extremity.     An incision was then made 2 cm lateral to the   anterior superior iliac spine extending over the orientation of the   tensor fascia lata muscle and sharp dissection was carried down to the   fascia of the muscle.      The fascia was then incised.  The muscle belly was identified and swept   laterally and retractor placed along the superior neck.  Following   cauterization of the circumflex vessels and removing some pericapsular   fat, a second cobra retractor was placed on the inferior neck.  A capsulectomy was performed was made along the line of the   superior neck to the trochanteric fossa,  then extended proximally and   distally.  We then identified the trochanteric fossa and   orientation of my neck cut and then made a neck osteotomy with the femur on traction.  The femoral neck fractured segments were removed then the femoral   head was removed without difficulty or complication.  Traction was let   off and retractors were placed posterior and anterior around the   acetabulum.      The labrum and foveal tissue were debrided.  I began reaming with a 44 mm   reamer and reamed up to 51 mm reamer with good bony bed preparation and a 52 mm  cup was chosen.  The final 52 mm Pinnacle cup was then impacted under fluoroscopy to confirm the depth of penetration and orientation with respect to   Abduction and forward flexion.  A screw was placed into the ilium followed by the hole eliminator.  The final   36+4 neutral Altrex liner was impacted with good visualized rim fit.  The cup was positioned anatomically within the acetabular portion of the pelvis.      At this point, the femur was rolled to 100  degrees.  Further capsule was   released off the inferior aspect of the femoral neck.  I then   released the superior capsule proximally.  With the leg in a neutral position the hook was placed laterally   along the femur under the vastus lateralis origin and elevated manually and then held in position using the hook attachment on the bed.  The leg was then extended and adducted with the leg rolled to 100   degrees of external rotation.  Retractors were placed along the medial calcar and posteriorly over the greater trochanter.  Once the proximal femur was fully   exposed, I used a box osteotome to set orientation.  I then began   broaching with the starting chili pepper broach and passed this by hand and then broached up to 7.  With the 7 broach in place I chose a high offset neck and did several trial reductions.  The offset was appropriate, leg lengths   appeared to be equal best matched with the +5 versus the 1.5 head ball trial confirmed radiographically.   Given these findings, I went ahead and dislocated the hip, repositioned all   retractors and positioned the right hip in the extended and abducted position.  The final 7 Hi Actis stem was   chosen and it was impacted down to the level of neck cut.  Based on this   and the trial reductions, a final 36+5 Articuleze metal head ball was chosen and   impacted onto a clean and dry trunnion, and the hip was reduced.  The   hip had been irrigated throughout the case again at this point.  I did   reapproximate the superior capsular leaflet to the anterior leaflet   using #1 Vicryl.  The fascia of the   tensor fascia lata muscle was then reapproximated using #1 Vicryl and #0 Stratafix sutures.  The   remaining wound was closed with 2-0 Vicryl and running 4-0 Monocryl.   The hip was cleaned, dried, and dressed sterilely using Dermabond and   Aquacel dressing.  The patient was then brought   to recovery room in stable condition tolerating the  procedure well.    Griffith Citron, PA-C was present for the entirety of the case involved from   preoperative positioning, perioperative retractor management, general  facilitation of the case, as well as primary wound closure as assistant.            Pietro Cassis Alvan Dame, M.D.        07/30/2020 7:03 PM

## 2020-07-30 NOTE — Interval H&P Note (Signed)
History and Physical Interval Note:  07/30/2020 7:23 PM  [name]  has presented today for surgery, with the diagnosis of Hip Fx.  The various methods of treatment have been discussed with the patient and family. After consideration of risks, benefits and other options for treatment, the patient has consented to  Procedure(s): TOTAL HIP ARTHROPLASTY ANTERIOR APPROACH (Left) as a surgical intervention.  The patient's history has been reviewed, patient examined, no change in status, stable for surgery.  I have reviewed the patient's chart and labs.  Questions were answered to the patient's satisfaction.     Mauri Pole

## 2020-07-30 NOTE — Anesthesia Procedure Notes (Signed)
Spinal  Patient location during procedure: OR Start time: 07/30/2020 5:44 PM End time: 07/30/2020 5:46 PM Staffing Performed: anesthesiologist  Anesthesiologist: Darral Dash, DO Preanesthetic Checklist Completed: patient identified, IV checked, site marked, risks and benefits discussed, surgical consent, monitors and equipment checked, pre-op evaluation and timeout performed Spinal Block Patient position: right lateral decubitus Prep: DuraPrep Patient monitoring: heart rate, cardiac monitor, continuous pulse ox and blood pressure Approach: midline Location: L3-4 Injection technique: single-shot Needle Needle type: Pencan  Needle gauge: 24 G Needle length: 10 cm Additional Notes Patient identified. Risks/Benefits/Options discussed with patient including but not limited to bleeding, infection, nerve damage, paralysis, failed block, incomplete pain control, headache, blood pressure changes, nausea, vomiting, reactions to medications, itching and postpartum back pain. Confirmed with bedside nurse the patient's most recent platelet count. Confirmed with patient that they are not currently taking any anticoagulation, have any bleeding history or any family history of bleeding disorders. Patient expressed understanding and wished to proceed. All questions were answered. Sterile technique was used throughout the entire procedure. Please see nursing notes for vital signs. Warning signs of high block given to the patient including shortness of breath, tingling/numbness in hands, complete motor block, or any concerning symptoms with instructions to call for help. Patient was given instructions on fall risk and not to get out of bed. All questions and concerns addressed with instructions to call with any issues or inadequate analgesia.

## 2020-07-30 NOTE — Discharge Instructions (Signed)

## 2020-07-30 NOTE — H&P (View-Only) (Signed)
Patient ID: [name], female   DOB: 07-Jul-1945, 75 y.o.   MRN: [id number] I was unable to get her to the OR yesterday or last night due to challenges with OR availability and time. Allowed her to eat and now NPO for plans to treat later today Planning on THR due to age, health and activity level

## 2020-07-30 NOTE — Anesthesia Postprocedure Evaluation (Signed)
Anesthesia Post Note  Patient: [name]  Procedure(s) Performed: TOTAL HIP ARTHROPLASTY ANTERIOR APPROACH (Left Hip)     Patient location during evaluation: PACU Anesthesia Type: Spinal Level of consciousness: awake and alert Pain management: pain level controlled Vital Signs Assessment: post-procedure vital signs reviewed and stable Respiratory status: spontaneous breathing and respiratory function stable Cardiovascular status: blood pressure returned to baseline and stable Postop Assessment: spinal receding and no apparent nausea or vomiting Anesthetic complications: no   No complications documented.  Last Vitals:  Vitals:   07/30/20 2000 07/30/20 2022  BP: (!) 143/58 (!) 142/63  Pulse: 69 68  Resp: 14 16  Temp: (!) 36.4 C 36.5 C  SpO2: 99% 98%    Last Pain:  Vitals:   07/30/20 2022  TempSrc: Oral  PainSc:                  Audry Pili

## 2020-07-30 NOTE — Progress Notes (Signed)
Initial Nutrition Assessment  DOCUMENTATION CODES:   Not applicable  INTERVENTION:   -Glucerna Shake po TID, each supplement provides 220 kcal and 10 grams of protein -MVI with minerals daily  NUTRITION DIAGNOSIS:   Increased nutrient needs related to post-op healing as evidenced by estimated needs.  GOAL:   Patient will meet greater than or equal to 90% of their needs  MONITOR:   PO intake,Supplement acceptance,Diet advancement,Labs,Weight trends,Skin,I & O's  REASON FOR ASSESSMENT:   Consult Assessment of nutrition requirement/status,Hip fracture protocol  ASSESSMENT:   [name] is a 75 y.o. female with medical history significant for breast cancer more than 30 years ago, hypertension, insulin-dependent diabetes mellitus, and hypothyroidism, who presents with severe pain involving the left hip and left chest wall after a mechanical fall at home on 07/26/2020.  Pt admitted with lt hip fracture s/p fall.   Reviewed I/O's: 0 ml x 24 hours  Per orthopedics notes, plan for surgery today.   Spoke with pt at bedside, who was pleasant and in good spirits today. She reports that she typically is a "nibbler" and does not eat much at baseline (Breakfast: toast and eggs; Lunch: sandwich; Dinner: spaghetti). Pt drinks water and diet green tea.   Pt denies any weight loss. Her UBW is around 145-148# per her report. She denies any changes in mobility or the way her clothing fits. She describes herself as a very active person, doing yard work and taking care of her 55 year old grandson.   Discussed importance of good meal and supplement intake to promote healing. Pt amenable to supplements.   Medications reviewed and include vitamin D.   Lab Results  Component Value Date   HGBA1C 9.5 (H) 07/28/2020   PTA DM medications are 1500 mg metformin daily and 32 units insulin detemir daly.   Labs reviewed: K: 3.4, CBGS: 69-153 (inpatient orders for glycemic control are 0-9 units  insulin aspart every 4 hours).   NUTRITION - FOCUSED PHYSICAL EXAM:  Flowsheet Row Most Recent Value  Orbital Region No depletion  Upper Arm Region No depletion  Thoracic and Lumbar Region No depletion  Buccal Region No depletion  Temple Region No depletion  Clavicle Bone Region No depletion  Clavicle and Acromion Bone Region No depletion  Scapular Bone Region No depletion  Dorsal Hand No depletion  Patellar Region No depletion  Anterior Thigh Region No depletion  Posterior Calf Region No depletion  Edema (RD Assessment) None  Hair Reviewed  Eyes Reviewed  Mouth Reviewed  Skin Reviewed  Nails Reviewed       Diet Order:   Diet Order            Diet NPO time specified  Diet effective midnight                 EDUCATION NEEDS:   Education needs have been addressed  Skin:  Skin Assessment: Reviewed RN Assessment  Last BM:  07/28/20  Height:   Ht Readings from Last 1 Encounters:  07/30/20 5\' 7"  (1.702 m)    Weight:   Wt Readings from Last 1 Encounters:  07/30/20 75.1 kg    Ideal Body Weight:  61.4 kg  BMI:  Body mass index is 25.93 kg/m.  Estimated Nutritional Needs:   Kcal:  1800-2000  Protein:  95-110 grams  Fluid:  > 1.8 L    Loistine Chance, RD, LDN, [place] Registered Dietitian II Certified Diabetes Care and Education Specialist Please refer to [facility] for RD  and/or RD on-call/weekend/after hours pager

## 2020-07-30 NOTE — Progress Notes (Signed)
[facility] Progress Note  Patient: [name]    MNO:[id number]  DOA: 07/28/2020     Date of Service: the patient was seen and examined on 07/30/2020  Brief hospital course: Past medical history of breast cancer, HTN, IDDM, hypothyroidism.  Presents with complaints of a fall with left hip pain as well as left chest wall pain found to have transcervical left femoral neck fracture.  SP left total hip replacement on 3/9. Currently plan is monitor postop recovery.  Assessment and Plan: 1.  Left transcervical femoral neck fracture SP left total hip replacement 3/9. Postop pain management, weightbearing, DVT prophylaxis per surgery. Monitor daily CBC and BMP.  2. Mild renal insufficiency; hyperkalemia; metabolic acidosis  SCr is 1.24 on admission without recent labs for comparison,  serum potassium is 6.3 without EKG changes but specimen was hemolyzed. Patient received treatment for hyperkalemia with Lokelma hold lisinopril-HCTZ  3. Insulin-dependent DM  uncontrolled with hyperglycemia. Hemoglobin A1c 9.5. Currently on sliding scale insulin. Monitor.  4. Hypertension  Hold lisinopril and HCTZ in setting of hyperkalemia and hypovolemia   5. Hypothyroidism  Continue Synthroid    6. Anemia  Hgb is 10.0 on admission without overt bleeding, was 10-11 range in 2010  Type and screen, monitor    7. Hyperbilirubinemia Total bilirubin 2.7 on admission  No RUQ tenderness, has been elevated intermittently in the past   Body mass index is 25.93 kg/m.  Nutrition Problem: Increased nutrient needs Etiology: post-op healing Interventions: Interventions: MVI,Glucerna shake  Diet: N.p.o. for surgery DVT Prophylaxis:   SCDs Start: 07/28/20 2251  Advance goals of care discussion: Full code  Family Communication: family was present at bedside, at the time of interview.  The pt provided permission to discuss medical plan with the family. Opportunity was given to ask question  and all questions were answered satisfactorily.   Disposition:  Status is: Inpatient  Remains inpatient appropriate because:Ongoing diagnostic testing needed not appropriate for outpatient work up   Dispo: The patient is from: Home              Anticipated d/c is to: SNF              Patient currently is not medically stable to d/c.   Difficult to place patient No  Subjective: No nausea no vomiting.  No fever no chills.  No chest pain.  No abdominal pain.  Reports pain well controlled.  Physical Exam:  General: Appear in mild distress, no Rash; Oral Mucosa Clear, moist. no Abnormal Neck Mass Or lumps, Conjunctiva normal  Cardiovascular: S1 and S2 Present, aortic systolic  Murmur, Respiratory: good respiratory effort, Bilateral Air entry present and CTA, no Crackles, no wheezes Abdomen: Bowel Sound present, Soft and no tenderness Extremities: no Pedal edema Neurology: alert and oriented to time, place, and person affect appropriate. no new focal deficit Gait not checked due to patient safety concerns  Vitals:   07/30/20 1923 07/30/20 1930 07/30/20 1945 07/30/20 2000  BP: (!) 98/50 (!) 115/59 127/62 (!) 143/58  Pulse: 80 83  69  Resp: 17 (!) 21  14  Temp: (!) 97 F (36.1 C)   (!) 97.5 F (36.4 C)  TempSrc:      SpO2: 100% 100%  99%  Weight:      Height:        Intake/Output Summary (Last 24 hours) at 07/30/2020 2004 Last data filed at 07/30/2020 1859 Gross per 24 hour  Intake 1200 ml  Output 1175 ml  Net 25 ml   Filed Weights   07/30/20 1413 07/30/20 1714  Weight: 75.1 kg 75.1 kg    Data Reviewed: I have personally reviewed and interpreted daily labs, tele strips, imaging. I reviewed all nursing notes, pharmacy notes, vitals, pertinent old records I have discussed plan of care as described above with RN and patient/family.  CBC: Recent Labs  Lab 07/28/20 2216 07/29/20 0415 07/30/20 0232  WBC 8.1 7.8 7.1  NEUTROABS 6.5  --   --   HGB 10.0* 9.9* 10.7*  HCT  30.4* 30.4* 30.9*  MCV 92.7 93.0 89.0  PLT 168 163 660   Basic Metabolic Panel: Recent Labs  Lab 07/28/20 2111 07/28/20 2249 07/28/20 2257 07/29/20 0415 07/30/20 0232  NA 133*  --   --  135 139  K 6.3* 4.3 4.4 4.3 3.4*  CL 102  --   --  104 105  CO2 15*  --   --  18* 24  GLUCOSE 265*  --   --  331* 71  BUN 17  --   --  16 13  CREATININE 1.24*  --   --  1.22* 0.97  CALCIUM 8.7*  --   --  8.4* 8.7*    Studies: DG Pelvis Portable  Result Date: 07/30/2020 CLINICAL DATA:  Postop left hip replacement. EXAM: PORTABLE PELVIS 1-2 VIEWS COMPARISON:  Preoperative radiograph 07/28/2020 FINDINGS: Left hip arthroplasty in expected alignment. No periprosthetic lucency. Recent postsurgical change includes air and edema in the joint space and soft tissues. Pubic rami are intact. IMPRESSION: Left hip arthroplasty without immediate postoperative complication. Electronically Signed   By: Keith Rake M.D.   On: 07/30/2020 19:40    Scheduled Meds: . [MAR Hold] atorvastatin  20 mg Oral Daily  . [MAR Hold] Chlorhexidine Gluconate Cloth  6 each Topical Daily  . [MAR Hold] feeding supplement (GLUCERNA SHAKE)  237 mL Oral TID BM  . [MAR Hold] insulin aspart  0-9 Units Subcutaneous Q4H  . [MAR Hold] levothyroxine  100 mcg Oral Once per day on Mon Wed Fri Sat  . [MAR Hold] multivitamin with minerals  1 tablet Oral Daily  . povidone-iodine  2 application Topical Once  . [MAR Hold] Vitamin D (Ergocalciferol)  50,000 Units Oral Q Thu   Continuous Infusions: . sodium chloride 10 mL/hr at 07/30/20 1716   PRN Meds: [MAR Hold] albuterol, [MAR Hold] clonazePAM, fentaNYL (SUBLIMAZE) injection, [MAR Hold] labetalol, [MAR Hold]  morphine injection, [MAR Hold] ondansetron (ZOFRAN) IV, oxyCODONE **OR** oxyCODONE, promethazine, [MAR Hold] senna-docusate  Time spent: 35 minutes  Author: Berle Mull, MD [facility] 07/30/2020 8:04 PM  To reach On-call, see care teams to locate the attending and reach  out via [email or web address]. Between 7PM-7AM, please contact night-coverage If you still have difficulty reaching the attending provider, please page the [facility] (Director on Call) for [facility] on amion for assistance.

## 2020-07-30 NOTE — Plan of Care (Signed)
Patient arrived to unit stable. Daughter at bedside. Patient refused insulin, states she bottoms out fast, educated patient and will retake blood sugar at MN and reassess level. Will continue to monitor patient.   Problem: Education: Goal: Knowledge of General Education information will improve Description: Including pain rating scale, medication(s)/side effects and non-pharmacologic comfort measures Outcome: Progressing   Problem: Activity: Goal: Risk for activity intolerance will decrease Outcome: Progressing   Problem: Pain Managment: Goal: General experience of comfort will improve Outcome: Progressing   Problem: Safety: Goal: Ability to remain free from injury will improve Outcome: Progressing   Problem: Skin Integrity: Goal: Risk for impaired skin integrity will decrease Outcome: Progressing

## 2020-07-30 NOTE — Anesthesia Procedure Notes (Signed)
Procedure Name: MAC Date/Time: 07/30/2020 5:35 PM Performed by: Janene Harvey, CRNA Pre-anesthesia Checklist: Patient identified, Emergency Drugs available, Suction available and Patient being monitored Patient Re-evaluated:Patient Re-evaluated prior to induction Oxygen Delivery Method: Simple face mask Induction Type: IV induction Placement Confirmation: positive ETCO2 Dental Injury: Teeth and Oropharynx as per pre-operative assessment

## 2020-07-31 DIAGNOSIS — S72002A Fracture of unspecified part of neck of left femur, initial encounter for closed fracture: Secondary | ICD-10-CM | POA: Diagnosis not present

## 2020-07-31 LAB — CBC
HCT: 28 % — ABNORMAL LOW (ref 36.0–46.0)
Hemoglobin: 9.6 g/dL — ABNORMAL LOW (ref 12.0–15.0)
MCH: 31.4 pg (ref 26.0–34.0)
MCHC: 34.3 g/dL (ref 30.0–36.0)
MCV: 91.5 fL (ref 80.0–100.0)
Platelets: 187 10*3/uL (ref 150–400)
RBC: 3.06 MIL/uL — ABNORMAL LOW (ref 3.87–5.11)
RDW: 13 % (ref 11.5–15.5)
WBC: 8.7 10*3/uL (ref 4.0–10.5)
nRBC: 0 % (ref 0.0–0.2)

## 2020-07-31 LAB — BASIC METABOLIC PANEL
Anion gap: 12 (ref 5–15)
BUN: 15 mg/dL (ref 8–23)
CO2: 20 mmol/L — ABNORMAL LOW (ref 22–32)
Calcium: 7.8 mg/dL — ABNORMAL LOW (ref 8.9–10.3)
Chloride: 104 mmol/L (ref 98–111)
Creatinine, Ser: 1.19 mg/dL — ABNORMAL HIGH (ref 0.44–1.00)
GFR, Estimated: 47 mL/min — ABNORMAL LOW (ref >60)
Glucose, Bld: 274 mg/dL — ABNORMAL HIGH (ref 70–99)
Potassium: 4.2 mmol/L (ref 3.5–5.1)
Sodium: 136 mmol/L (ref 135–145)

## 2020-07-31 LAB — GLUCOSE, CAPILLARY
Glucose-Capillary: 208 mg/dL — ABNORMAL HIGH (ref 70–99)
Glucose-Capillary: 237 mg/dL — ABNORMAL HIGH (ref 70–99)
Glucose-Capillary: 237 mg/dL — ABNORMAL HIGH (ref 70–99)
Glucose-Capillary: 251 mg/dL — ABNORMAL HIGH (ref 70–99)
Glucose-Capillary: 262 mg/dL — ABNORMAL HIGH (ref 70–99)
Glucose-Capillary: 277 mg/dL — ABNORMAL HIGH (ref 70–99)

## 2020-07-31 MED ORDER — SENNOSIDES-DOCUSATE SODIUM 8.6-50 MG PO TABS
1.0000 | ORAL_TABLET | Freq: Two times a day (BID) | ORAL | Status: DC
  Administered 2020-07-31 – 2020-08-04 (×8): 1 via ORAL
  Filled 2020-07-31 (×9): qty 1

## 2020-07-31 MED ORDER — INSULIN DETEMIR 100 UNIT/ML ~~LOC~~ SOLN
20.0000 [IU] | Freq: Every day | SUBCUTANEOUS | Status: DC
  Administered 2020-08-01 – 2020-08-03 (×3): 20 [IU] via SUBCUTANEOUS
  Filled 2020-07-31 (×3): qty 0.2

## 2020-07-31 MED ORDER — INSULIN DETEMIR 100 UNIT/ML ~~LOC~~ SOLN
8.0000 [IU] | Freq: Every day | SUBCUTANEOUS | Status: DC
  Administered 2020-07-31: 8 [IU] via SUBCUTANEOUS
  Filled 2020-07-31: qty 0.08

## 2020-07-31 MED ORDER — HYDROCODONE-ACETAMINOPHEN 5-325 MG PO TABS: 1.0000 | ORAL_TABLET | Freq: Four times a day (QID) | ORAL | 0 refills | Status: AC | PRN

## 2020-07-31 MED ORDER — INSULIN ASPART 100 UNIT/ML ~~LOC~~ SOLN
0.0000 [IU] | Freq: Three times a day (TID) | SUBCUTANEOUS | Status: DC
  Administered 2020-07-31: 8 [IU] via SUBCUTANEOUS
  Administered 2020-07-31 – 2020-08-01 (×2): 5 [IU] via SUBCUTANEOUS
  Administered 2020-08-01: 11 [IU] via SUBCUTANEOUS
  Administered 2020-08-01: 3 [IU] via SUBCUTANEOUS
  Administered 2020-08-02 (×2): 2 [IU] via SUBCUTANEOUS
  Administered 2020-08-02: 3 [IU] via SUBCUTANEOUS
  Administered 2020-08-03: 5 [IU] via SUBCUTANEOUS
  Administered 2020-08-03: 15 [IU] via SUBCUTANEOUS
  Administered 2020-08-03: 2 [IU] via SUBCUTANEOUS
  Administered 2020-08-04: 3 [IU] via SUBCUTANEOUS
  Administered 2020-08-04: 8 [IU] via SUBCUTANEOUS

## 2020-07-31 MED ORDER — HYDROCODONE-ACETAMINOPHEN 5-325 MG PO TABS
1.0000 | ORAL_TABLET | ORAL | Status: DC | PRN
  Administered 2020-07-31 – 2020-08-04 (×9): 1 via ORAL
  Filled 2020-07-31 (×9): qty 1

## 2020-07-31 MED ORDER — ASPIRIN 81 MG PO CHEW: 81.0000 mg | CHEWABLE_TABLET | Freq: Two times a day (BID) | ORAL | 0 refills | Status: AC

## 2020-07-31 MED ORDER — INSULIN ASPART 100 UNIT/ML ~~LOC~~ SOLN
0.0000 [IU] | Freq: Every day | SUBCUTANEOUS | Status: DC
  Administered 2020-07-31: 3 [IU] via SUBCUTANEOUS
  Administered 2020-08-01 – 2020-08-02 (×2): 2 [IU] via SUBCUTANEOUS

## 2020-07-31 MED ORDER — METHOCARBAMOL 500 MG PO TABS: 500.0000 mg | ORAL_TABLET | Freq: Four times a day (QID) | ORAL | 0 refills | Status: AC | PRN

## 2020-07-31 NOTE — Progress Notes (Addendum)
[facility] Progress Note  Patient: [name]    QHU:[id number]  DOA: 07/28/2020     Date of Service: the patient was seen and examined on 07/31/2020  Brief hospital course: Past medical history of breast cancer, HTN, IDDM, hypothyroidism.  Presents with complaints of a fall with left hip pain as well as left chest wall pain found to have transcervical left femoral neck fracture.  SP left total hip replacement on 3/9. Currently plan is monitor postop recovery.  Assessment and Plan: 1.  Left transcervical femoral neck fracture SP left total hip replacement 3/9. Postop pain management, weightbearing, DVT prophylaxis per surgery. Monitor daily CBC and BMP.  2. Mild renal insufficiency; hyperkalemia; metabolic acidosis  SCr is 1.24 on admission without recent labs for comparison,  serum potassium is 6.3 without EKG changes but specimen was hemolyzed. Patient received treatment for hyperkalemia with Lokelma hold lisinopril-HCTZ. Initiate gentle IV hydration for now and monitor renal function.  3. Insulin-dependent DM  uncontrolled with hyperglycemia. Hemoglobin A1c 9.5. Currently on sliding scale insulin. Due to hypoglycemic episode Levemir was on hold.  We will resume lower dose of Levemir. Monitor.  4. Hypertension  Hold lisinopril and HCTZ in setting of hyperkalemia and hypovolemia   5. Hypothyroidism  Continue Synthroid    6. Anemia likely nutritional chronic Hgb is 10.0 on admission without overt bleeding, was 10-11 range in 2010  monitor . Will check labs tomorrow  7. Hyperbilirubinemia Total bilirubin 2.7 on admission  No RUQ tenderness, has been elevated intermittently in the past   8.  Constipation. Continue bowel regimen.  Body mass index is 25.93 kg/m.  Nutrition Problem: Increased nutrient needs Etiology: post-op healing Interventions: Interventions: MVI,Glucerna shake  Diet: Cardiac diet DVT Prophylaxis:   SCDs Start: 07/30/20 2034 Place  TED hose Start: 07/30/20 2034 SCDs Start: 07/28/20 2251  Advance goals of care discussion: Full code  Family Communication: no family was present at bedside, at the time of interview.   Disposition:  Status is: Inpatient  Remains inpatient appropriate because:Ongoing diagnostic testing needed not appropriate for outpatient work up   Dispo: The patient is from: Home              Anticipated d/c is to: SNF              Patient currently is not medically stable to d/c.   Difficult to place patient No  Subjective: No nausea no vomiting.  No fever no chills.  Reports constipation.  Minimal oral intake.  Unable to sleep last night.  Physical Exam:  General: Appear in mild distress, no Rash; Oral Mucosa Clear, moist. no Abnormal Neck Mass Or lumps, Conjunctiva normal  Cardiovascular: S1 and S2 Present, aortic systolic Murmur, Respiratory: good respiratory effort, Bilateral Air entry present and CTA, no Crackles, no wheezes Abdomen: Bowel Sound present, Soft and no tenderness Extremities: no Pedal edema Neurology: alert and oriented to time, place, and person affect appropriate. no new focal deficit Gait not checked due to patient safety concerns    Vitals:   07/31/20 0006 07/31/20 0459 07/31/20 0748 07/31/20 1428  BP: (!) 125/56 115/79 (!) 148/62 (!) 157/58  Pulse: 89 80 75 84  Resp: 16 18 17 17   Temp: 98 F (36.7 C) 98.4 F (36.9 C) 98.7 F (37.1 C) 99.3 F (37.4 C)  TempSrc: Oral Oral Oral Oral  SpO2: 98% 98% 99% 93%  Weight:      Height:        Intake/Output Summary (  Last 24 hours) at 07/31/2020 1818 Last data filed at 07/30/2020 1859 Gross per 24 hour  Intake 1000 ml  Output 125 ml  Net 875 ml   Filed Weights   07/30/20 1413 07/30/20 1714  Weight: 75.1 kg 75.1 kg    Data Reviewed: I have personally reviewed and interpreted daily labs, tele strips, imaging. I reviewed all nursing notes, pharmacy notes, vitals, pertinent old records I have discussed plan of care  as described above with RN and patient/family.  CBC: Recent Labs  Lab 07/28/20 2216 07/29/20 0415 07/30/20 0232 07/31/20 0153  WBC 8.1 7.8 7.1 8.7  NEUTROABS 6.5  --   --   --   HGB 10.0* 9.9* 10.7* 9.6*  HCT 30.4* 30.4* 30.9* 28.0*  MCV 92.7 93.0 89.0 91.5  PLT 168 163 205 756   Basic Metabolic Panel: Recent Labs  Lab 07/28/20 2111 07/28/20 2249 07/28/20 2257 07/29/20 0415 07/30/20 0232 07/31/20 0153  NA 133*  --   --  135 139 136  K 6.3* 4.3 4.4 4.3 3.4* 4.2  CL 102  --   --  104 105 104  CO2 15*  --   --  18* 24 20*  GLUCOSE 265*  --   --  331* 71 274*  BUN 17  --   --  16 13 15   CREATININE 1.24*  --   --  1.22* 0.97 1.19*  CALCIUM 8.7*  --   --  8.4* 8.7* 7.8*    Studies: DG Pelvis Portable  Result Date: 07/30/2020 CLINICAL DATA:  Postop left hip replacement. EXAM: PORTABLE PELVIS 1-2 VIEWS COMPARISON:  Preoperative radiograph 07/28/2020 FINDINGS: Left hip arthroplasty in expected alignment. No periprosthetic lucency. Recent postsurgical change includes air and edema in the joint space and soft tissues. Pubic rami are intact. IMPRESSION: Left hip arthroplasty without immediate postoperative complication. Electronically Signed   By: Keith Rake M.D.   On: 07/30/2020 19:40   DG C-Arm 1-60 Min  Result Date: 07/30/2020 CLINICAL DATA:  Left hip replacement. EXAM: OPERATIVE LEFT HIP (WITH PELVIS IF PERFORMED) TECHNIQUE: Fluoroscopic spot image(s) were submitted for interpretation post-operatively. COMPARISON:  Preoperative radiograph 07/28/2020 FINDINGS: Two fluoroscopic spot views obtained in the operating room during left hip arthroplasty. Total fluoroscopy time 8 seconds. Total dose 0.844 mGy. IMPRESSION: Procedural fluoroscopy during left hip arthroplasty. Electronically Signed   By: Keith Rake M.D.   On: 07/30/2020 20:18   DG HIP OPERATIVE UNILAT W OR W/O PELVIS LEFT  Result Date: 07/30/2020 CLINICAL DATA:  Left hip replacement. EXAM: OPERATIVE LEFT HIP (WITH  PELVIS IF PERFORMED) TECHNIQUE: Fluoroscopic spot image(s) were submitted for interpretation post-operatively. COMPARISON:  Preoperative radiograph 07/28/2020 FINDINGS: Two fluoroscopic spot views obtained in the operating room during left hip arthroplasty. Total fluoroscopy time 8 seconds. Total dose 0.844 mGy. IMPRESSION: Procedural fluoroscopy during left hip arthroplasty. Electronically Signed   By: Keith Rake M.D.   On: 07/30/2020 20:18    Scheduled Meds: . aspirin  81 mg Oral BID  . atorvastatin  20 mg Oral Daily  . Chlorhexidine Gluconate Cloth  6 each Topical Daily  . feeding supplement (GLUCERNA SHAKE)  237 mL Oral TID BM  . ferrous sulfate  325 mg Oral TID PC  . insulin aspart  0-15 Units Subcutaneous TID WC  . insulin aspart  0-5 Units Subcutaneous QHS  . [START ON 08/01/2020] insulin detemir  20 Units Subcutaneous Daily  . levothyroxine  100 mcg Oral Once per day on Mon Wed Fri Sat  .  multivitamin with minerals  1 tablet Oral Daily  . polyethylene glycol  17 g Oral BID  . senna-docusate  1 tablet Oral BID  . Vitamin D (Ergocalciferol)  50,000 Units Oral Q Thu   Continuous Infusions: . sodium chloride 100 mL/hr at 07/31/20 1111  . methocarbamol (ROBAXIN) IV     PRN Meds: albuterol, alum & mag hydroxide-simeth, bisacodyl, clonazePAM, diphenhydrAMINE, HYDROcodone-acetaminophen, labetalol, magnesium citrate, menthol-cetylpyridinium **OR** phenol, methocarbamol **OR** methocarbamol (ROBAXIN) IV, morphine injection, ondansetron **OR** ondansetron (ZOFRAN) IV  Time spent: 35 minutes  Author: Berle Mull, MD [facility] 07/31/2020 6:18 PM  To reach On-call, see care teams to locate the attending and reach out via [email or web address]. Between 7PM-7AM, please contact night-coverage If you still have difficulty reaching the attending provider, please page the [facility] (Director on Call) for [facility] on amion for assistance.

## 2020-07-31 NOTE — Progress Notes (Addendum)
     Subjective: 1 Day Post-Op Procedure(s) (LRB): TOTAL HIP ARTHROPLASTY ANTERIOR APPROACH (Left)   Patient reports pain as mild, pain controlled. No reported events throughout the night.  Discussed the procedure and expectations moving forward.  Orthopaedically she is stable.  Follow up in the clinic in 2 weeks.  Knows to call with any questions or concerns.       Objective:   VITALS:   Vitals:   07/31/20 0459 07/31/20 0748  BP: 115/79 (!) 148/62  Pulse: 80 75  Resp: 18 17  Temp: 98.4 F (36.9 C) 98.7 F (37.1 C)  SpO2: 98% 99%    Dorsiflexion/Plantar flexion intact Incision: dressing C/D/I No cellulitis present Compartment soft  LABS Recent Labs    07/29/20 0415 07/30/20 0232 07/31/20 0153  HGB 9.9* 10.7* 9.6*  HCT 30.4* 30.9* 28.0*  WBC 7.8 7.1 8.7  PLT 163 205 187    Recent Labs    07/29/20 0415 07/30/20 0232 07/31/20 0153  NA 135 139 136  K 4.3 3.4* 4.2  BUN 16 13 15   CREATININE 1.22* 0.97 1.19*  GLUCOSE 331* 71 274*     Assessment/Plan: 1 Day Post-Op Procedure(s) (LRB): TOTAL HIP ARTHROPLASTY ANTERIOR APPROACH (Left)   Advance diet Up with therapy Orthopaedically stable Discharge home when ready medically  Ortho recommendations:  ASA 81 mg bid for 4 weeks for anticoagulation, unless other medically indicated.  Norco for pain management (Rx written).  Robaxin for muscle spasms(Rx written).  MiraLax and Colace for constipation  Iron 325 mg tid for 2-3 weeks   WBAT on the left leg.  Dressing to remain in place until follow in clinic in 2 weeks.  Dressing is waterproof and may shower with it in place.  Follow up in 2 weeks at [facility]. Follow up with OLIN,Camaryn Lumbert D in 2 weeks.  Contact information:  [facility] [address] [place] [phone]          Danae Orleans PA-C  [facility]  [facility] [address], [place], [place],  [place] [address] Phone: [phone] [email or web address] Facebook  [employer]

## 2020-07-31 NOTE — TOC CAGE-AID Note (Signed)
Transition of Care [facility]) - CAGE-AID Screening   Patient Details  Name: [name] MRN: [id number] Date of Birth: 09/26/1945   Dia Crawford, RN Phone Number:[phone] 07/31/2020, 12:34 PM   Clinical Narrative:  Pt denies etoh/tobacco/drug usage, no educational resources needed.    CAGE-AID Screening:    Have You Ever Felt You Ought to Cut Down on Your Drinking or Drug Use?: No Have People Annoyed You By Critizing Your Drinking Or Drug Use?: No Have You Felt Bad Or Guilty About Your Drinking Or Drug Use?: No Have You Ever Had a Drink or Used Drugs First Thing In The Morning to Steady Your Nerves or to Get Rid of a Hangover?: No CAGE-AID Score: 0  Substance Abuse Education Offered: No

## 2020-07-31 NOTE — Evaluation (Signed)
Physical Therapy Evaluation Patient Details Name: [name] MRN: [id number] DOB: [birth year] Today's Date: 07/31/2020   History of Present Illness  75 y.o. female admitted on 07/28/20 s/p fall with resultant LL hip fx and sternal bruising, mild renal insufficiency, hyperkalemia, metabolic acidosis.  Pt s/p L direct ant THA on 07/30/20.  Pt with significant PMH of Essential HTN, DM2, CA (breast) s/p mastectomy.  Clinical Impression  For as active as this pt was PTA, I anticipated her to move a bit better today.  However, she is sore in her chest from the fall and has difficulty pressing through her arms for bed mobility and RW use.  Mod assist overall with RW for OOB to the chair. She was not ready to ambulate today.   PT to follow acutely for deficits listed below.  Pt's preference is to go home and I told her she has to be able to walk, get up and down stairs and will likely need someone initially to stay with her.     Follow Up Recommendations SNF    Equipment Recommendations  Rolling walker with 5" wheels;3in1 (PT)    Recommendations for Other Services OT consult     Precautions / Restrictions Precautions Precautions: Fall Restrictions LLE Weight Bearing: Weight bearing as tolerated      Mobility  Bed Mobility Overal bed mobility: Needs Assistance Bed Mobility: Supine to Sit     Supine to sit: Mod assist;HOB elevated     General bed mobility comments: Mod assist to help progress left leg to EOB and support trunk with posterior preference to come up to sitting, assist from bed pad to help rotate hips around.  cues for sequencing.  Warm up exercises completed in bed on L hip before moving to EOB.    Transfers Overall transfer level: Needs assistance Equipment used: Rolling walker (2 wheeled) Transfers: Sit to/from [facility] Sit to Stand: Mod assist Stand pivot transfers: Mod assist       General transfer comment: Mod assist to come to standing from  elevated bed, mod assist to turn to the recliner chair.  Pt unable to take steps without buckling and reports pushing on the RW hurts her chest.  Ambulation/Gait             General Gait Details: unable to yet  Stairs            Wheelchair Mobility    Modified Rankin (Stroke Patients Only)       Balance Overall balance assessment: Needs assistance Sitting-balance support: Feet supported;Bilateral upper extremity supported Sitting balance-Leahy Scale: Poor Sitting balance - Comments: min to mod assist to prevent posterior LOB EOB. Postural control: Posterior lean Standing balance support: Bilateral upper extremity supported Standing balance-Leahy Scale: Poor Standing balance comment: mod assist with RW and therapist's support to stand EOB.                             Pertinent Vitals/Pain Pain Assessment: 0-10 Pain Score: 7  Pain Location: left hip Pain Descriptors / Indicators: Grimacing;Guarding Pain Intervention(s): Limited activity within patient's tolerance;Monitored during session;Repositioned;RN gave pain meds during session;Patient requesting pain meds-RN notified    Home Living Family/patient expects to be discharged to:: Private residence Living Arrangements: Alone Available Help at Discharge: Family;Other (Comment) (daughter's house is next door) Type of Home: House Home Access: Stairs to enter Entrance Stairs-Rails: Right;Left (back stairs, front stairs two cannot reach both) Entrance Stairs-Number of  Steps: 10 (back door, front door there are only 2 little ones) Home Layout: One level;Laundry or work area in basement;Other (Comment) (has a basement, but does not go down there) Home Equipment: Programme researcher, broadcasting/film/video - 2 wheels Additional Comments: works in the yard    Prior Function Level of Independence: Independent         Comments: drives     Hand Dominance   Dominant Hand: Right    Extremity/Trunk Assessment   Upper  Extremity Assessment Upper Extremity Assessment: Defer to OT evaluation    Lower Extremity Assessment Lower Extremity Assessment: LLE deficits/detail LLE Deficits / Details: left leg with normal post op pain and weakness.  Ankle WFL, knee 3-/5, hip 2/5    Cervical / Trunk Assessment Cervical / Trunk Assessment: Other exceptions Cervical / Trunk Exceptions: Pt with painful sternum (bruising from the fall).  Communication   Communication: No difficulties  Cognition Arousal/Alertness: Awake/alert Behavior During Therapy: WFL for tasks assessed/performed Overall Cognitive Status: Within Functional Limits for tasks assessed                                        General Comments      Exercises Total Joint Exercises Ankle Circles/Pumps: AROM;Both;20 reps Quad Sets: AROM;Left;10 reps Heel Slides: AAROM;Left;10 reps Hip ABduction/ADduction: AAROM;Left;10 reps   Assessment/Plan    PT Assessment Patient needs continued PT services  PT Problem List Decreased strength;Decreased activity tolerance;Decreased range of motion;Decreased balance;Decreased mobility;Decreased knowledge of use of DME;Pain       PT Treatment Interventions DME instruction;Stair training;Gait training;Functional mobility training;Therapeutic activities;Balance training;Therapeutic exercise;Patient/family education;Wheelchair mobility training;Manual techniques;Modalities    PT Goals (Current goals can be found in the Care Plan section)  Acute Rehab PT Goals Patient Stated Goal: to get back to normal so she can continue to play/care for her grandson PT Goal Formulation: With patient Time For Goal Achievement: 08/14/20 Potential to Achieve Goals: Good    Frequency Min 5X/week (keeping at 5xs/wk to monitor progress despite SNF rec)   Barriers to discharge   needs initial 24/7 care    Co-evaluation               AM-PAC PT "6 Clicks" Mobility  Outcome Measure Help needed turning from  your back to your side while in a flat bed without using bedrails?: A Lot Help needed moving from lying on your back to sitting on the side of a flat bed without using bedrails?: A Lot Help needed moving to and from a bed to a chair (including a wheelchair)?: A Lot Help needed standing up from a chair using your arms (e.g., wheelchair or bedside chair)?: A Lot Help needed to walk in hospital room?: Total Help needed climbing 3-5 steps with a railing? : Total 6 Click Score: 10    End of Session Equipment Utilized During Treatment: Gait belt Activity Tolerance: Patient limited by pain Patient left: in bed;with call bell/phone within reach;with chair alarm set;with family/visitor present   PT Visit Diagnosis: Muscle weakness (generalized) (M62.81);Difficulty in walking, not elsewhere classified (R26.2);Pain Pain - Right/Left: Left Pain - part of body: Hip    Time: 5732-2025 PT Time Calculation (min) (ACUTE ONLY): 40 min   Charges:   PT Evaluation $PT Eval Moderate Complexity: 1 Mod PT Treatments $Therapeutic Exercise: 8-22 mins $Therapeutic Activity: 8-22 mins        Verdene Lennert, PT, DPT  Acute  Rehabilitation #[phone] pager #[phone] office

## 2020-08-01 DIAGNOSIS — S72002A Fracture of unspecified part of neck of left femur, initial encounter for closed fracture: Secondary | ICD-10-CM | POA: Diagnosis not present

## 2020-08-01 LAB — BASIC METABOLIC PANEL
Anion gap: 8 (ref 5–15)
BUN: 12 mg/dL (ref 8–23)
CO2: 22 mmol/L (ref 22–32)
Calcium: 8.1 mg/dL — ABNORMAL LOW (ref 8.9–10.3)
Chloride: 107 mmol/L (ref 98–111)
Creatinine, Ser: 1.04 mg/dL — ABNORMAL HIGH (ref 0.44–1.00)
GFR, Estimated: 55 mL/min — ABNORMAL LOW (ref >60)
Glucose, Bld: 215 mg/dL — ABNORMAL HIGH (ref 70–99)
Potassium: 3.7 mmol/L (ref 3.5–5.1)
Sodium: 137 mmol/L (ref 135–145)

## 2020-08-01 LAB — GLUCOSE, CAPILLARY
Glucose-Capillary: 194 mg/dL — ABNORMAL HIGH (ref 70–99)
Glucose-Capillary: 328 mg/dL — ABNORMAL HIGH (ref 70–99)
Glucose-Capillary: 208 mg/dL — ABNORMAL HIGH (ref 70–99)
Glucose-Capillary: 227 mg/dL — ABNORMAL HIGH (ref 70–99)

## 2020-08-01 LAB — CBC
HCT: 24.6 % — ABNORMAL LOW (ref 36.0–46.0)
Hemoglobin: 8.8 g/dL — ABNORMAL LOW (ref 12.0–15.0)
MCH: 31.7 pg (ref 26.0–34.0)
MCHC: 35.8 g/dL (ref 30.0–36.0)
MCV: 88.5 fL (ref 80.0–100.0)
Platelets: 151 10*3/uL (ref 150–400)
RBC: 2.78 MIL/uL — ABNORMAL LOW (ref 3.87–5.11)
RDW: 12.7 % (ref 11.5–15.5)
WBC: 5.7 10*3/uL (ref 4.0–10.5)
nRBC: 0 % (ref 0.0–0.2)

## 2020-08-01 MED ORDER — MAGNESIUM HYDROXIDE 400 MG/5ML PO SUSP
15.0000 mL | Freq: Once | ORAL | Status: AC
  Administered 2020-08-01: 15 mL via ORAL
  Filled 2020-08-01: qty 30

## 2020-08-01 NOTE — Progress Notes (Signed)
Inpatient Diabetes Program Recommendations  AACE/ADA: New Consensus Statement on Inpatient Glycemic Control (2015)  Target Ranges:  Prepandial:   less than 140 mg/dL      Peak postprandial:   less than 180 mg/dL (1-2 hours)      Critically ill patients:  140 - 180 mg/dL   Lab Results  Component Value Date   GLUCAP 328 (H) 08/01/2020   HGBA1C 9.5 (H) 07/28/2020    Review of Glycemic Control Results for [name], [name] (MRN [id number]) as of 08/01/2020 11:25  Ref. Range 07/31/2020 08:37 07/31/2020 12:03 07/31/2020 16:26 07/31/2020 22:24 08/01/2020 06:07 08/01/2020 11:21  Glucose-Capillary Latest Ref Range: 70 - 99 mg/dL 237 (H) 277 (H) 237 (H) 251 (H) 194 (H) 328 (H)   Diabetes history: DM 2 Outpatient Diabetes medications: Levemir 32 units Daily, Metformin 1,500 mg qevening Current orders for Inpatient glycemic control:  Levemir 20 units Daily Novolog 0-15 units tid + hs  Inpatient Diabetes Program Recommendations:    -  Increase Levemir to 25 units -  Add Novolog 4 units tid meal coverage if eating >50% of meals.   Thanks,  Tama Headings RN, MSN, BC-ADM Inpatient Diabetes Coordinator Team Pager [phone] (8a-5p)

## 2020-08-01 NOTE — Progress Notes (Signed)
Physical Therapy Treatment Patient Details Name: [name] MRN: [id number] DOB: November 19, 1945 Today's Date: 08/01/2020    History of Present Illness 75 y.o. female admitted on 07/28/20 s/p fall with resultant LL hip fx and sternal bruising, mild renal insufficiency, hyperkalemia, metabolic acidosis.  Pt s/p L direct ant THA on 07/30/20.  Pt with significant PMH of Essential HTN, DM2, CA (breast) s/p mastectomy.    PT Comments    Pt was able to progress to short distance gait across the room with RW and needed heavy min assist.  We progressed further in her HEP and she was up once before with OT.  She remains post acute appropriate as she needs to be able to walk at a mod I level to go home alone.  PT will continue to follow acutely for safe mobility progression.  Follow Up Recommendations  SNF     Equipment Recommendations  Rolling walker with 5" wheels;3in1 (PT)    Recommendations for Other Services       Precautions / Restrictions Precautions Precautions: Fall Restrictions Weight Bearing Restrictions: No LLE Weight Bearing: Weight bearing as tolerated    Mobility  Bed Mobility Overal bed mobility: Needs Assistance Bed Mobility: Supine to Sit     Supine to sit: Min assist;HOB elevated     General bed mobility comments: Min assist to help progress L leg over EOB, educated on half bridge technique.  Left HOB up as pt reports she normally sleeps in a recliner chair.    Transfers Overall transfer level: Needs assistance Equipment used: Rolling walker (2 wheeled) Transfers: Sit to/from Stand Sit to Stand: Min assist Stand pivot transfers: Mod assist       General transfer comment: Heavy min assist with slow transitions, bed elevated.  Ambulation/Gait Ambulation/Gait assistance: Min assist Gait Distance (Feet): 10 Feet Assistive device: Rolling walker (2 wheeled) Gait Pattern/deviations: Step-to pattern;Antalgic     General Gait Details: Cues for correct LE  sequencing, min assist to support pt during WB on L leg.  Followed with chair   Stairs             Wheelchair Mobility    Modified Rankin (Stroke Patients Only)       Balance Overall balance assessment: Needs assistance Sitting-balance support: Feet supported;Bilateral upper extremity supported Sitting balance-Leahy Scale: Poor Sitting balance - Comments: relies on bil UE to prevent posterior LOB Postural control: Posterior lean Standing balance support: Bilateral upper extremity supported Standing balance-Leahy Scale: Poor Standing balance comment: needs external support from RW and therapist.                            Cognition Arousal/Alertness: Awake/alert Behavior During Therapy: WFL for tasks assessed/performed Overall Cognitive Status: Within Functional Limits for tasks assessed                                        Exercises Total Joint Exercises Ankle Circles/Pumps: AROM;Both;20 reps Quad Sets: AROM;Left;10 reps Short Arc Quad: AROM;Left;10 reps Heel Slides: AAROM;Left;10 reps Hip ABduction/ADduction: AAROM;Left;10 reps Long Arc Quad: AROM;Left;10 reps    General Comments        Pertinent Vitals/Pain Pain Assessment: Faces Pain Score: 7  Faces Pain Scale: Hurts little more Pain Location: left hip, sternum Pain Descriptors / Indicators: Grimacing;Guarding Pain Intervention(s): Limited activity within patient's tolerance;Monitored during session;Repositioned    Home  Living Family/patient expects to be discharged to:: Private residence Living Arrangements: Alone Available Help at Discharge: Family;Other (Comment) (daughter lives next door) Type of Home: House Home Access: Stairs to enter Entrance Stairs-Rails: Right;Left Home Layout: One level;Laundry or work area in [place]: Programme researcher, broadcasting/film/video - 2 wheels Additional Comments: works in the yard    Prior Function Level of Independence: Independent       Comments: drives   PT Goals (current goals can now be found in the care plan section) Acute Rehab PT Goals Patient Stated Goal: to get back to normal so she can continue to play/care for her grandson Progress towards PT goals: Progressing toward goals    Frequency    Min 5X/week (despite SNF rec as I believe she will progress well)      PT Plan Current plan remains appropriate    Co-evaluation              AM-PAC PT "6 Clicks" Mobility   Outcome Measure  Help needed turning from your back to your side while in a flat bed without using bedrails?: A Lot Help needed moving from lying on your back to sitting on the side of a flat bed without using bedrails?: A Little Help needed moving to and from a bed to a chair (including a wheelchair)?: A Little Help needed standing up from a chair using your arms (e.g., wheelchair or bedside chair)?: A Little Help needed to walk in hospital room?: A Little Help needed climbing 3-5 steps with a railing? : A Lot 6 Click Score: 16    End of Session Equipment Utilized During Treatment: Gait belt Activity Tolerance: Patient limited by pain Patient left: in chair;with call bell/phone within reach;with chair alarm set Nurse Communication: Mobility status PT Visit Diagnosis: Muscle weakness (generalized) (M62.81);Difficulty in walking, not elsewhere classified (R26.2);Pain Pain - Right/Left: Left Pain - part of body: Hip     Time: 1600-1636 PT Time Calculation (min) (ACUTE ONLY): 36 min  Charges:  $Gait Training: 8-22 mins $Therapeutic Exercise: 8-22 mins                    Verdene Lennert, PT, DPT  Acute Rehabilitation [phone] pager [phone]) [phone] office

## 2020-08-01 NOTE — Progress Notes (Signed)
[facility] Progress Note  Patient: [name]    EQA:[id number]  DOA: 07/28/2020     Date of Service: the patient was seen and examined on 08/01/2020  Brief hospital course: Past medical history of breast cancer, HTN, IDDM, hypothyroidism.  Presents with complaints of a fall with left hip pain as well as left chest wall pain found to have transcervical left femoral neck fracture.  SP left total hip replacement on 3/9. Currently plan is monitor postop recovery.  Assessment and Plan: 1.  Left transcervical femoral neck fracture SP left total hip replacement 3/9. Postop pain management, weightbearing, DVT prophylaxis per surgery. Monitor daily CBC and BMP.  2. Mild renal insufficiency; hyperkalemia; metabolic acidosis  SCr is 1.24 on admission without recent labs for comparison,  serum potassium is 6.3 without EKG changes but specimen was hemolyzed. Patient received treatment for hyperkalemia with Lokelma hold lisinopril-HCTZ. Initiate gentle IV hydration for now and monitor renal function.  3. Insulin-dependent DM  uncontrolled with hyperglycemia. Hemoglobin A1c 9.5. Currently on sliding scale insulin. Due to hypoglycemic episode Levemir was on hold.  We will resume lower dose of Levemir. Monitor.  4. Hypertension  Hold lisinopril and HCTZ in setting of hyperkalemia and hypovolemia   5. Hypothyroidism  Continue Synthroid    6. Anemia likely nutritional chronic Hgb is 10.0 on admission without overt bleeding, was 10-11 range in 2010  monitor . Will check labs tomorrow  7. Hyperbilirubinemia Total bilirubin 2.7 on admission  No RUQ tenderness, has been elevated intermittently in the past   8.  Constipation. Continue bowel regimen.  Body mass index is 25.93 kg/m.  Nutrition Problem: Increased nutrient needs Etiology: post-op healing Interventions: Interventions: MVI,Glucerna shake  Diet: Cardiac diet DVT Prophylaxis:   SCDs Start: 07/30/20 2034 Place  TED hose Start: 07/30/20 2034 SCDs Start: 07/28/20 2251  Advance goals of care discussion: Full code  Family Communication: no family was present at bedside, at the time of interview.   Disposition:  Status is: Inpatient  Remains inpatient appropriate because:Ongoing diagnostic testing needed not appropriate for outpatient work up   Dispo: The patient is from: Home              Anticipated d/c is to: SNF              Patient currently is not medically stable to d/c.   Difficult to place patient No  Subjective: No acute complaint.  No nausea no vomiting.  Reports ongoing pain reports ongoing difficulty with sleepiness.  Still no bowel movement.  Physical Exam:  General: Appear in mild distress, no Rash; Oral Mucosa Clear, moist. no Abnormal Neck Mass Or lumps, Conjunctiva normal  Cardiovascular: S1 and S2 Present, no Murmur, Respiratory: good respiratory effort, Bilateral Air entry present and CTA, no Crackles, no wheezes Abdomen: Bowel Sound present, Soft and no tenderness Extremities: no Pedal edema Neurology: alert and oriented to time, place, and person affect appropriate. no new focal deficit Gait not checked due to patient safety concerns  Vitals:   07/31/20 2024 08/01/20 0424 08/01/20 0727 08/01/20 1421  BP: (!) 152/69 136/60 (!) 146/64 (!) 126/56  Pulse: 83 73 76 86  Resp: 18 17 17 17   Temp: 98.9 F (37.2 C) 98.2 F (36.8 C) 98.6 F (37 C) 98.3 F (36.8 C)  TempSrc: Oral Oral Oral Oral  SpO2: 98% 100% 98% 97%  Weight:      Height:        Intake/Output Summary (Last 24 hours)  at 08/01/2020 1941 Last data filed at 08/01/2020 1318 Gross per 24 hour  Intake 2013.87 ml  Output 350 ml  Net 1663.87 ml   Filed Weights   07/30/20 1413 07/30/20 1714  Weight: 75.1 kg 75.1 kg    Data Reviewed: I have personally reviewed and interpreted daily labs, tele strips, imaging. I reviewed all nursing notes, pharmacy notes, vitals, pertinent old records I have discussed  plan of care as described above with RN and patient/family.  CBC: Recent Labs  Lab 07/28/20 2216 07/29/20 0415 07/30/20 0232 07/31/20 0153 08/01/20 0219  WBC 8.1 7.8 7.1 8.7 5.7  NEUTROABS 6.5  --   --   --   --   HGB 10.0* 9.9* 10.7* 9.6* 8.8*  HCT 30.4* 30.4* 30.9* 28.0* 24.6*  MCV 92.7 93.0 89.0 91.5 88.5  PLT 168 163 205 187 021   Basic Metabolic Panel: Recent Labs  Lab 07/28/20 2111 07/28/20 2249 07/28/20 2257 07/29/20 0415 07/30/20 0232 07/31/20 0153 08/01/20 0219  NA 133*  --   --  135 139 136 137  K 6.3*   < > 4.4 4.3 3.4* 4.2 3.7  CL 102  --   --  104 105 104 107  CO2 15*  --   --  18* 24 20* 22  GLUCOSE 265*  --   --  331* 71 274* 215*  BUN 17  --   --  16 13 15 12   CREATININE 1.24*  --   --  1.22* 0.97 1.19* 1.04*  CALCIUM 8.7*  --   --  8.4* 8.7* 7.8* 8.1*   < > = values in this interval not displayed.    Studies: No results found.  Scheduled Meds: . aspirin  81 mg Oral BID  . atorvastatin  20 mg Oral Daily  . Chlorhexidine Gluconate Cloth  6 each Topical Daily  . feeding supplement (GLUCERNA SHAKE)  237 mL Oral TID BM  . ferrous sulfate  325 mg Oral TID PC  . insulin aspart  0-15 Units Subcutaneous TID WC  . insulin aspart  0-5 Units Subcutaneous QHS  . insulin detemir  20 Units Subcutaneous Daily  . levothyroxine  100 mcg Oral Once per day on Mon Wed Fri Sat  . multivitamin with minerals  1 tablet Oral Daily  . polyethylene glycol  17 g Oral BID  . senna-docusate  1 tablet Oral BID  . Vitamin D (Ergocalciferol)  50,000 Units Oral Q Thu   Continuous Infusions: . sodium chloride 100 mL/hr at 07/31/20 1111  . methocarbamol (ROBAXIN) IV     PRN Meds: albuterol, alum & mag hydroxide-simeth, bisacodyl, clonazePAM, diphenhydrAMINE, HYDROcodone-acetaminophen, labetalol, magnesium citrate, menthol-cetylpyridinium **OR** phenol, methocarbamol **OR** methocarbamol (ROBAXIN) IV, morphine injection, ondansetron **OR** ondansetron (ZOFRAN) IV  Time spent:  35 minutes  Author: Berle Mull, MD [facility] 08/01/2020 7:41 PM  To reach On-call, see care teams to locate the attending and reach out via [email or web address]. Between 7PM-7AM, please contact night-coverage If you still have difficulty reaching the attending provider, please page the [facility] (Director on Call) for [facility] on amion for assistance.

## 2020-08-01 NOTE — Plan of Care (Signed)

## 2020-08-01 NOTE — Evaluation (Signed)
Occupational Therapy Evaluation Patient Details Name: [name] MRN: [id number] DOB: 1945/09/02 Today's Date: 08/01/2020    History of Present Illness 75 y.o. female admitted on 07/28/20 s/p fall with resultant LL hip fx and sternal bruising, mild renal insufficiency, hyperkalemia, metabolic acidosis.  Pt s/p L direct ant THA on 07/30/20.  Pt with significant PMH of Essential HTN, DM2, CA (breast) s/p mastectomy.   Clinical Impression   Pt presents with decline in function and safety with ADLs and ADL mobility with impaired strength, balance and endurance; pt limited by sternal pain from bruising. PTA, pt lived at home alone (her daughter lives next door) and was Ind with ADLs/selfcare, home mgt and mobility. Pt currently required mod A to sit EOB, min guard A with UB ADLs, max - total A with LB ADLs, total A with toileting and mod A with mobility using RW. Pt would benefit from acute OT services to address impairments to maximize level of function and safety    Follow Up Recommendations  SNF    Equipment Recommendations  3 in 1 bedside commode;Other (comment) (RW, reacher)    Recommendations for Other Services       Precautions / Restrictions Precautions Precautions: Fall Restrictions Weight Bearing Restrictions: No LLE Weight Bearing: Weight bearing as tolerated      Mobility Bed Mobility Overal bed mobility: Needs Assistance Bed Mobility: Supine to Sit     Supine to sit: Mod assist;HOB elevated     General bed mobility comments: Mod assist to help progress left leg to EOB and support trunk with posterior preference to come up to sitting, assist from bed pad to help rotate hips around.  cues for sequencing.    Transfers Overall transfer level: Needs assistance Equipment used: Rolling walker (2 wheeled) Transfers: Sit to/from [facility] Sit to Stand: Mod assist Stand pivot transfers: Mod assist            Balance Overall balance assessment:  Needs assistance Sitting-balance support: Feet supported;Bilateral upper extremity supported Sitting balance-Leahy Scale: Fair     Standing balance support: Bilateral upper extremity supported;During functional activity Standing balance-Leahy Scale: Poor                             ADL either performed or assessed with clinical judgement   ADL Overall ADL's : Needs assistance/impaired Eating/Feeding: Set up;Independent;Sitting   Grooming: Wash/dry hands;Wash/dry face;Brushing hair;Min guard;Sitting   Upper Body Bathing: Minimal assistance;Sitting   Lower Body Bathing: Maximal assistance   Upper Body Dressing : Minimal assistance;Sitting   Lower Body Dressing: Total assistance   Toilet Transfer: Moderate assistance;RW;Stand-pivot;Cueing for safety;Cueing for sequencing   Toileting- Clothing Manipulation and Hygiene: Total assistance;Sit to/from stand       Functional mobility during ADLs: Moderate assistance;Cueing for safety;Cueing for sequencing;Rolling walker       Vision Baseline Vision/History: Wears glasses Patient Visual Report: No change from baseline       Perception     Praxis      Pertinent Vitals/Pain Pain Assessment: 0-10 Pain Score: 7  Pain Location: left hip Pain Descriptors / Indicators: Grimacing;Guarding Pain Intervention(s): Limited activity within patient's tolerance;Monitored during session;Repositioned;Patient requesting pain meds-RN notified     Hand Dominance Right   Extremity/Trunk Assessment Upper Extremity Assessment Upper Extremity Assessment: Generalized weakness   Lower Extremity Assessment Lower Extremity Assessment: Defer to PT evaluation   Cervical / Trunk Assessment Cervical / Trunk Assessment: Other exceptions Cervical /  Trunk Exceptions: Pt with painful sternum (bruising from the fall).   Communication Communication Communication: No difficulties   Cognition Arousal/Alertness: Awake/alert Behavior  During Therapy: WFL for tasks assessed/performed Overall Cognitive Status: Within Functional Limits for tasks assessed                                     General Comments       Exercises     Shoulder Instructions      Home Living Family/patient expects to be discharged to:: Private residence Living Arrangements: Alone Available Help at Discharge: Family;Other (Comment) (daughter lives next door) Type of Home: House Home Access: Stairs to enter [employer] of Steps: 10 Entrance Stairs-Rails: Right;Left Home Layout: One level;Laundry or work area in [place]: Programmer, systems: No   Home Equipment: Programme researcher, broadcasting/film/video - 2 wheels   Additional Comments: works in the yard      Prior Functioning/Environment Level of Independence: Independent        Comments: drives        OT Problem List: Decreased strength;Impaired balance (sitting and/or standing);Pain;Decreased safety awareness;Decreased activity tolerance;Decreased knowledge of use of DME or AE      OT Treatment/Interventions: Self-care/ADL training;Patient/family education;Balance training;Therapeutic activities;DME and/or AE instruction    OT Goals(Current goals can be found in the care plan section) Acute Rehab OT Goals Patient Stated Goal: to get back to normal so she can continue to play/care for her grandson OT Goal Formulation: With patient Time For Goal Achievement: 08/15/20 Potential to Achieve Goals: Good ADL Goals Pt Will Perform Grooming: with supervision;with set-up;sitting Pt Will Perform Upper Body Bathing: with supervision;with set-up;sitting Pt Will Perform Lower Body Bathing: with mod assist;sitting/lateral leans Pt Will Perform Upper Body Dressing: with supervision;with set-up;sitting Pt Will Transfer to Toilet: with min assist;ambulating;stand pivot transfer Pt Will Perform Toileting - Clothing Manipulation and hygiene:  with max assist;with mod assist;sit to/from stand  OT Frequency: Min 2X/week   Barriers to D/C:            Co-evaluation              AM-PAC OT "6 Clicks" Daily Activity     Outcome Measure Help from another person eating meals?: None Help from another person taking care of personal grooming?: A Little Help from another person toileting, which includes using toliet, bedpan, or urinal?: Total Help from another person bathing (including washing, rinsing, drying)?: A Lot Help from another person to put on and taking off regular upper body clothing?: A Little Help from another person to put on and taking off regular lower body clothing?: Total 6 Click Score: 14   End of Session Equipment Utilized During Treatment: Gait belt;Rolling walker;Other (comment) [facility]) Nurse Communication: Mobility status  Activity Tolerance: Patient tolerated treatment well Patient left: in chair;with call bell/phone within reach;with chair alarm set  OT Visit Diagnosis: Unsteadiness on feet (R26.81);Other abnormalities of gait and mobility (R26.89);History of falling (Z91.81);Muscle weakness (generalized) (M62.81);Pain Pain - Right/Left: Left Pain - part of body: Hip;Leg                Time: 4235-3614 OT Time Calculation (min): 25 min Charges:  OT General Charges $OT Visit: 1 Visit OT Evaluation $OT Eval Moderate Complexity: 1 Mod OT Treatments $Self Care/Home Management : 8-22 mins    Britt Bottom 08/01/2020, 1:36 PM

## 2020-08-02 ENCOUNTER — Inpatient Hospital Stay (HOSPITAL_COMMUNITY): Payer: Medicare Other

## 2020-08-02 DIAGNOSIS — S72002A Fracture of unspecified part of neck of left femur, initial encounter for closed fracture: Secondary | ICD-10-CM | POA: Diagnosis not present

## 2020-08-02 LAB — BASIC METABOLIC PANEL
Anion gap: 5 (ref 5–15)
BUN: 12 mg/dL (ref 8–23)
CO2: 28 mmol/L (ref 22–32)
Calcium: 8.6 mg/dL — ABNORMAL LOW (ref 8.9–10.3)
Chloride: 105 mmol/L (ref 98–111)
Creatinine, Ser: 0.89 mg/dL (ref 0.44–1.00)
GFR, Estimated: >60 mL/min (ref >60)
Glucose, Bld: 149 mg/dL — ABNORMAL HIGH (ref 70–99)
Potassium: 3.9 mmol/L (ref 3.5–5.1)
Sodium: 138 mmol/L (ref 135–145)

## 2020-08-02 LAB — GLUCOSE, CAPILLARY
Glucose-Capillary: 135 mg/dL — ABNORMAL HIGH (ref 70–99)
Glucose-Capillary: 231 mg/dL — ABNORMAL HIGH (ref 70–99)
Glucose-Capillary: 194 mg/dL — ABNORMAL HIGH (ref 70–99)
Glucose-Capillary: 201 mg/dL — ABNORMAL HIGH (ref 70–99)

## 2020-08-02 LAB — CBC
HCT: 24.8 % — ABNORMAL LOW (ref 36.0–46.0)
Hemoglobin: 8.7 g/dL — ABNORMAL LOW (ref 12.0–15.0)
MCH: 31.3 pg (ref 26.0–34.0)
MCHC: 35.1 g/dL (ref 30.0–36.0)
MCV: 89.2 fL (ref 80.0–100.0)
Platelets: 187 10*3/uL (ref 150–400)
RBC: 2.78 MIL/uL — ABNORMAL LOW (ref 3.87–5.11)
RDW: 12.8 % (ref 11.5–15.5)
WBC: 5.5 10*3/uL (ref 4.0–10.5)
nRBC: 0 % (ref 0.0–0.2)

## 2020-08-02 NOTE — Plan of Care (Signed)
Patient is s/p left hip hemiarthroplasty by Dr. Alvan Dame. Aquacel to left hip clean dry and intact. Gave pain med x 1 this shift. NAD or needs voiced at this time. Will continue to monitor and continue current POC.

## 2020-08-02 NOTE — TOC Initial Note (Signed)
Transition of Care [facility]) - Initial/Assessment Note    Patient Details  Name: [name] MRN: [id number] Date of Birth: [birth year]  [facility]) CM/SW Contact:    Vinie Sill, [place] Phone Number: 08/02/2020, 12:33 PM  Clinical Narrative:                  CSW visit with patient at bedside along with her daughter,Sherry. CSW introduced self and explained role. CSW discussed therapy recommendation of short rehab at [facility]. Patient states he lives home alone. She acknowledges the need for rehab- and since she lives home alone, believes it would be unsafe for her to return home before going home. CSW explained the SNF process. Patient and family preferred SNF is [facility]. All question answered. Patient has received covid vaccine- no booster- copy of card placed in chart.   TOC will provide bed offers once available Insurance authorization started reference # [id number] TOC will continue to follow and assist with discharge planning.  Thurmond Butts, MSW, LCSW Clinical Social Worker   Expected Discharge Plan: Skilled Nursing Facility Barriers to Discharge: [place] Work up,SNF Pending bed offer   Patient Goals and CMS Choice        Expected Discharge Plan and Services Expected Discharge Plan: [place] In-house Referral: Clinical Social Work     Living arrangements for the past 2 months: Single Family Home                                      Prior Living Arrangements/Services Living arrangements for the past 2 months: Single Family Home Lives with:: Self Patient language and need for interpreter reviewed:: No        Need for Family Participation in Patient Care: Yes (Comment) Care giver support system in place?: Yes (comment)   Criminal Activity/Legal Involvement Pertinent to Current Situation/Hospitalization: No - Comment as needed  Activities of Daily Living Home Assistive Devices/Equipment:  Eyeglasses ADL Screening (condition at time of admission) Patient's cognitive ability adequate to safely complete daily activities?: Yes Is the patient deaf or have difficulty hearing?: No Does the patient have difficulty seeing, even when wearing glasses/contacts?: No Does the patient have difficulty concentrating, remembering, or making decisions?: No Patient able to express need for assistance with ADLs?: Yes Does the patient have difficulty dressing or bathing?: No Independently performs ADLs?: Yes (appropriate for developmental age) Does the patient have difficulty walking or climbing stairs?: Yes Weakness of Legs: Left Weakness of Arms/Hands: None  Permission Sought/Granted Permission sought to share information with : Family Supports Permission granted to share information with : Yes, Verbal Permission Granted  Share Information with NAME: Hunt Oris  Permission granted to share info w AGENCY: SNFs  Permission granted to share info w Relationship: daughter  Permission granted to share info w Contact Information: [phone]  Emotional Assessment Appearance:: Appears stated age Attitude/Demeanor/Rapport: Engaged Affect (typically observed): Accepting,Pleasant,Appropriate Orientation: : Oriented to Self,Oriented to Place,Oriented to  Time,Oriented to Situation Alcohol / Substance Use: Not Applicable Psych Involvement: No (comment)  Admission diagnosis:  Pain [R52] Multiple contusions [T07.XXXA] Fracture of femoral neck, left ([place]) [S72.002A] Closed fracture of left hip, initial encounter ([place]) [S72.002A] Fall, initial encounter [W19.XXXA] Closed left hip fracture, initial encounter [facility]) [S72.002A] Patient Active Problem List   Diagnosis Date Noted  . Closed left hip fracture, initial encounter ([place]) 07/28/2020  . Mild renal insufficiency 07/28/2020  .  Hyperkalemia 07/28/2020  . Metabolic acidosis 62/69/4854  . Diabetes mellitus, type II, insulin dependent ([place])  07/28/2020  . Essential hypertension 07/28/2020  . Hypothyroidism, unspecified 07/28/2020  . History of breast cancer 07/28/2020  . Normochromic anemia 07/28/2020   PCP:  Carolee Rota, NP Pharmacy:   [id number] - [place], [place] - [address] [place] [address] Phone: [phone] Fax: [phone]     Social Determinants of Health (SDOH) Interventions    Readmission Risk Interventions No flowsheet data found.

## 2020-08-02 NOTE — NC FL2 (Signed)
[place] LEVEL OF CARE SCREENING TOOL     IDENTIFICATION  Patient Name: [name] Birthdate: 12-18-1945 Sex: female Admission Date (Current Location): 07/28/2020  [facility] and [place] Number:  [place] and Address:  [address] [facility], [place] [address] [place], [place] [address]      Provider Number: [id number]  Attending Physician Name and Address:  Lavina Hamman, MD  Relative Name and Phone Number:       Current Level of Care: Hospital Recommended Level of Care: [place] Prior Approval Number:    Date Approved/Denied:   PASRR Number: [id number]  Discharge Plan: SNF    Current Diagnoses: Patient Active Problem List   Diagnosis Date Noted  . Closed left hip fracture, initial encounter ([place]) 07/28/2020  . Mild renal insufficiency 07/28/2020  . Hyperkalemia 07/28/2020  . Metabolic acidosis 73/41/9379  . Diabetes mellitus, type II, insulin dependent ([place]) 07/28/2020  . Essential hypertension 07/28/2020  . Hypothyroidism, unspecified 07/28/2020  . History of breast cancer 07/28/2020  . Normochromic anemia 07/28/2020    Orientation RESPIRATION BLADDER Height & Weight     Self,Time,Situation,Place  Normal Continent Weight: 165 lb 9.1 oz (75.1 kg) Height:  5' 7.01" (170.2 cm)  BEHAVIORAL SYMPTOMS/MOOD NEUROLOGICAL BOWEL NUTRITION STATUS      Continent Diet (please see discharge summary)  AMBULATORY STATUS COMMUNICATION OF NEEDS Skin     Verbally Surgical wounds (closed incision Left Hip)                       Personal Care Assistance Level of Assistance  Bathing,Feeding,Dressing Bathing Assistance: Limited assistance Feeding assistance: Independent Dressing Assistance: Limited assistance     Functional Limitations Info  Sight,Hearing,Speech Sight Info: Adequate Hearing Info: Adequate Speech Info: Adequate    SPECIAL CARE FACTORS FREQUENCY  PT (By licensed PT),OT (By licensed OT)     PT  Frequency: 5x per week OT Frequency: 5x per week            Contractures Contractures Info: Not present    Additional Factors Info  Code Status,Allergies Code Status Info: FULL Allergies Info: Calcium,Sulfa Antibiotics           Current Medications (08/02/2020):  This is the current hospital active medication list Current Facility-Administered Medications  Medication Dose Route Frequency Provider Last Rate Last Admin  . 0.9 %  sodium chloride infusion   Intravenous Continuous Maurice March, PA-C 100 mL/hr at 07/31/20 1111 New Bag at 07/31/20 1111  . albuterol (VENTOLIN HFA) 108 (90 Base) MCG/ACT inhaler 2 puff  2 puff Inhalation Q6H PRN Maurice March, PA-C      . alum & mag hydroxide-simeth (MAALOX/MYLANTA) 200-200-20 MG/5ML suspension 15 mL  15 mL Oral Q4H PRN Maurice March, PA-C      . aspirin chewable tablet 81 mg  81 mg Oral BID Maurice March, PA-C   81 mg at 08/02/20 0946  . atorvastatin (LIPITOR) tablet 20 mg  20 mg Oral Daily Maurice March, PA-C   20 mg at 08/02/20 0945  . bisacodyl (DULCOLAX) suppository 10 mg  10 mg Rectal Daily PRN Maurice March, PA-C      . Chlorhexidine Gluconate Cloth 2 % PADS 6 each  6 each Topical Daily Maurice March, PA-C   6 each at 08/02/20 0950  . clonazePAM (KLONOPIN) disintegrating tablet 0.25-0.5 mg  0.25-0.5 mg Oral BID PRN Maurice March, PA-C      .  diphenhydrAMINE (BENADRYL) 12.5 MG/5ML elixir 12.5-25 mg  12.5-25 mg Oral Q4H PRN Maurice March, PA-C      . feeding supplement (GLUCERNA SHAKE) (GLUCERNA SHAKE) liquid 237 mL  237 mL Oral TID BM Maurice March, PA-C   237 mL at 08/02/20 0947  . ferrous sulfate tablet 325 mg  325 mg Oral TID PC Maurice March, PA-C   325 mg at 08/02/20 0754  . HYDROcodone-acetaminophen (NORCO/VICODIN) 5-325 MG per tablet 1-2 tablet  1-2 tablet Oral Q4H PRN Danae Orleans, PA-C   1 tablet at 08/02/20 0846  . insulin aspart (novoLOG) injection 0-15 Units  0-15 Units  Subcutaneous TID WC Lavina Hamman, MD   2 Units at 08/02/20 0755  . insulin aspart (novoLOG) injection 0-5 Units  0-5 Units Subcutaneous QHS Lavina Hamman, MD   2 Units at 08/01/20 2121  . insulin detemir (LEVEMIR) injection 20 Units  20 Units Subcutaneous Daily Lavina Hamman, MD   20 Units at 08/02/20 [phone]  . labetalol (NORMODYNE) injection 10 mg  10 mg Intravenous Q2H PRN Maurice March, PA-C      . levothyroxine (SYNTHROID) tablet 100 mcg  100 mcg Oral Once per day on Mon Wed Fri Sat Maurice March, PA-C   100 mcg at 08/02/20 2836  . magnesium citrate solution 1 Bottle  1 Bottle Oral Once PRN Maurice March, PA-C      . menthol-cetylpyridinium (CEPACOL) lozenge 3 mg  1 lozenge Oral PRN Maurice March, PA-C       Or  . phenol (CHLORASEPTIC) mouth spray 1 spray  1 spray Mouth/Throat PRN Maurice March, PA-C      . methocarbamol (ROBAXIN) tablet 500 mg  500 mg Oral Q6H PRN Maurice March, PA-C   500 mg at 07/31/20 1722   Or  . methocarbamol (ROBAXIN) 500 mg in dextrose 5 % 50 mL IVPB  500 mg Intravenous Q6H PRN Maurice March, PA-C      . morphine 2 MG/ML injection 1-2 mg  1-2 mg Intravenous Q3H PRN Griffith Citron R, PA-C   2 mg at 07/31/20 1607  . multivitamin with minerals tablet 1 tablet  1 tablet Oral Daily Maurice March, PA-C   1 tablet at 08/02/20 0945  . ondansetron (ZOFRAN) tablet 4 mg  4 mg Oral Q6H PRN Griffith Citron R, PA-C       Or  . ondansetron [facility]) injection 4 mg  4 mg Intravenous Q6H PRN Maurice March, PA-C   4 mg at 07/31/20 0026  . polyethylene glycol (MIRALAX / GLYCOLAX) packet 17 g  17 g Oral BID Maurice March, PA-C   17 g at 08/02/20 0948  . senna-docusate (Senokot-S) tablet 1 tablet  1 tablet Oral BID Lavina Hamman, MD   1 tablet at 08/02/20 0947  . Vitamin D (Ergocalciferol) (DRISDOL) capsule 50,000 Units  50,000 Units Oral Q Juliann Mule, [place]   50,000 Units at 07/31/20 1100     Discharge Medications: Please see  discharge summary for a list of discharge medications.  Relevant Imaging Results:  Relevant Lab Results:   Additional Information SSN [id number]   Moderna 08/22/19 &  09/19/2019- no booster shot  Vinie Sill, [place]

## 2020-08-02 NOTE — Progress Notes (Signed)
[facility] Progress Note  Patient: [name]    CBJ:[id number]  DOA: 07/28/2020     Date of Service: the patient was seen and examined on 08/02/2020  Brief hospital course: Past medical history of breast cancer, HTN, IDDM, hypothyroidism.  Presents with complaints of a fall with left hip pain as well as left chest wall pain found to have transcervical left femoral neck fracture.  SP left total hip replacement on 3/9. Currently plan is monitor postop recovery.  Assessment and Plan: 1.  Left transcervical femoral neck fracture SP left total hip replacement 3/9. Postop pain management, weightbearing, DVT prophylaxis per surgery. Dressing remain in place until follow-up in clinic in 2 weeks. Weightbearing as tolerated on left leg. Aspirin 81 mg for 4 weeks for anticoagulation. Ferrous sulfate 325 mg for 3 weeks. Follow-up with [facility] in 2 weeks. PT OT recommends SNF.  2. Mild renal insufficiency; hyperkalemia; metabolic acidosis  SCr is 1.24 on admission without recent labs for comparison,  serum potassium is 6.3 without EKG changes but specimen was hemolyzed. Patient received treatment for hyperkalemia with Lokelma hold lisinopril-HCTZ. We will stop IV fluid no other than #improved.  3. Insulin-dependent DM  uncontrolled with hyperglycemia. Hemoglobin A1c 9.5. Currently on sliding scale insulin. Due to hypoglycemic episode Levemir was on hold.  We will resume lower dose of Levemir. Monitor.  4. Hypertension  Hold lisinopril and HCTZ in setting of hyperkalemia and hypovolemia   5. Hypothyroidism  Continue Synthroid    6. Anemia likely nutritional chronic Hgb is 10.0 on admission without overt bleeding, was 10-11 range in 2010  Hemoglobin has remained stable for last 2 days 8.8 and 8.7. Currently we will just monitor to get daily.  7. Hyperbilirubinemia Total bilirubin 2.7 on admission  No RUQ tenderness, has been elevated intermittently in the past    8.  Constipation. Continue bowel regimen.  Body mass index is 25.93 kg/m.  Nutrition Problem: Increased nutrient needs Etiology: post-op healing Interventions: Interventions: MVI,Glucerna shake  Diet: Cardiac diet DVT Prophylaxis:   SCDs Start: 07/30/20 2034 Place TED hose Start: 07/30/20 2034 SCDs Start: 07/28/20 2251  Advance goals of care discussion: Full code  Family Communication: no family was present at bedside, at the time of interview.   Disposition:  Status is: Inpatient  Remains inpatient appropriate because:Ongoing diagnostic testing needed not appropriate for outpatient work up   Dispo: The patient is from: Home              Anticipated d/c is to: SNF              Patient currently is not medically stable to d/c.   Difficult to place patient No  Subjective: No nausea no vomiting.  No fever no chills.  No chest pain.  Physical Exam:  General: Appear in mild distress, no Rash; Oral Mucosa Clear, moist. no Abnormal Neck Mass Or lumps, Conjunctiva normal  Cardiovascular: S1 and S2 Present, no Murmur, Respiratory: good respiratory effort, Bilateral Air entry present and CTA, no Crackles, no wheezes Abdomen: Bowel Sound present, Soft and no tenderness Extremities: no Pedal edema Neurology: alert and oriented to time, place, and person affect appropriate. no new focal deficit Gait not checked due to patient safety concerns  Vitals:   08/02/20 0300 08/02/20 0802 08/02/20 1215 08/02/20 1948  BP: 138/64 137/61 (!) 146/56 97/72  Pulse: 79 72 70 80  Resp: 18 18 18 17   Temp: 97.9 F (36.6 C) 98 F (36.7 C)  98.9 F (37.2 C)  TempSrc: Oral   Oral  SpO2: 99% 98% 97% 100%  Weight:      Height:        Intake/Output Summary (Last 24 hours) at 08/02/2020 2006 Last data filed at 08/02/2020 1900 Gross per 24 hour  Intake 720 ml  Output 550 ml  Net 170 ml   Filed Weights   07/30/20 1413 07/30/20 1714  Weight: 75.1 kg 75.1 kg    Data Reviewed: I have  personally reviewed and interpreted daily labs, tele strips, imaging. I reviewed all nursing notes, pharmacy notes, vitals, pertinent old records I have discussed plan of care as described above with RN and patient/family.  CBC: Recent Labs  Lab 07/28/20 2216 07/29/20 0415 07/30/20 0232 07/31/20 0153 08/01/20 0219 08/02/20 0314  WBC 8.1 7.8 7.1 8.7 5.7 5.5  NEUTROABS 6.5  --   --   --   --   --   HGB 10.0* 9.9* 10.7* 9.6* 8.8* 8.7*  HCT 30.4* 30.4* 30.9* 28.0* 24.6* 24.8*  MCV 92.7 93.0 89.0 91.5 88.5 89.2  PLT 168 163 205 187 151 235   Basic Metabolic Panel: Recent Labs  Lab 07/29/20 0415 07/30/20 0232 07/31/20 0153 08/01/20 0219 08/02/20 0314  NA 135 139 136 137 138  K 4.3 3.4* 4.2 3.7 3.9  CL 104 105 104 107 105  CO2 18* 24 20* 22 28  GLUCOSE 331* 71 274* 215* 149*  BUN 16 13 15 12 12   CREATININE 1.22* 0.97 1.19* 1.04* 0.89  CALCIUM 8.4* 8.7* 7.8* 8.1* 8.6*    Studies: DG Sternum  Result Date: 08/02/2020 CLINICAL DATA:  Chest pain. EXAM: STERNUM - 2+ VIEW COMPARISON:  Chest radiograph-07/28/2020 FINDINGS: Grossly unchanged enlarged cardiac silhouette and mediastinal contours. Left basilar/retrocardiac linear heterogeneous opacities are unchanged favored to represent atelectasis. No new focal airspace opacities. No pleural effusion or pneumothorax. No evidence of edema. No definite displaced sternal fracture. Sequela of previous mastectomy and bilateral axillary adenectomy. No radiopaque foreign body. IMPRESSION: 1. No definite displaced sternal fracture 2. Similar findings of cardiomegaly and left basilar atelectasis without superimposed acute cardiopulmonary disease. Electronically Signed   By: Sandi Mariscal M.D.   On: 08/02/2020 13:31   DG Abd Portable 1V  Result Date: 08/02/2020 CLINICAL DATA:  Fall with left hip and chest pain. EXAM: PORTABLE ABDOMEN - 1 VIEW COMPARISON:  August 10, 2008 FINDINGS: Hernia mash is at identified. The bowel gas pattern is nonobstructive.  The patient is status post left hip replacement. No acute abnormalities identified. IMPRESSION: No acute abnormalities identified. Electronically Signed   By: Dorise Bullion III M.D   On: 08/02/2020 11:55    Scheduled Meds: . aspirin  81 mg Oral BID  . atorvastatin  20 mg Oral Daily  . Chlorhexidine Gluconate Cloth  6 each Topical Daily  . feeding supplement (GLUCERNA SHAKE)  237 mL Oral TID BM  . ferrous sulfate  325 mg Oral TID PC  . insulin aspart  0-15 Units Subcutaneous TID WC  . insulin aspart  0-5 Units Subcutaneous QHS  . insulin detemir  20 Units Subcutaneous Daily  . levothyroxine  100 mcg Oral Once per day on Mon Wed Fri Sat  . multivitamin with minerals  1 tablet Oral Daily  . polyethylene glycol  17 g Oral BID  . senna-docusate  1 tablet Oral BID  . Vitamin D (Ergocalciferol)  50,000 Units Oral Q Thu   Continuous Infusions: . sodium chloride 100 mL/hr at 07/31/20 1111  .  methocarbamol (ROBAXIN) IV     PRN Meds: albuterol, alum & mag hydroxide-simeth, bisacodyl, clonazePAM, diphenhydrAMINE, HYDROcodone-acetaminophen, labetalol, magnesium citrate, menthol-cetylpyridinium **OR** phenol, methocarbamol **OR** methocarbamol (ROBAXIN) IV, morphine injection, ondansetron **OR** ondansetron (ZOFRAN) IV  Time spent: 35 minutes  Author: Berle Mull, MD [facility] 08/02/2020 8:06 PM  To reach On-call, see care teams to locate the attending and reach out via [email or web address]. Between 7PM-7AM, please contact night-coverage If you still have difficulty reaching the attending provider, please page the [facility] (Director on Call) for [facility] on amion for assistance.

## 2020-08-03 DIAGNOSIS — S72002A Fracture of unspecified part of neck of left femur, initial encounter for closed fracture: Secondary | ICD-10-CM | POA: Diagnosis not present

## 2020-08-03 LAB — BASIC METABOLIC PANEL
Anion gap: 5 (ref 5–15)
BUN: 10 mg/dL (ref 8–23)
CO2: 28 mmol/L (ref 22–32)
Calcium: 8.4 mg/dL — ABNORMAL LOW (ref 8.9–10.3)
Chloride: 104 mmol/L (ref 98–111)
Creatinine, Ser: 0.94 mg/dL (ref 0.44–1.00)
GFR, Estimated: >60 mL/min (ref >60)
Glucose, Bld: 134 mg/dL — ABNORMAL HIGH (ref 70–99)
Potassium: 4 mmol/L (ref 3.5–5.1)
Sodium: 137 mmol/L (ref 135–145)

## 2020-08-03 LAB — CBC
HCT: 24.9 % — ABNORMAL LOW (ref 36.0–46.0)
Hemoglobin: 8.5 g/dL — ABNORMAL LOW (ref 12.0–15.0)
MCH: 30.7 pg (ref 26.0–34.0)
MCHC: 34.1 g/dL (ref 30.0–36.0)
MCV: 89.9 fL (ref 80.0–100.0)
Platelets: 222 10*3/uL (ref 150–400)
RBC: 2.77 MIL/uL — ABNORMAL LOW (ref 3.87–5.11)
RDW: 12.9 % (ref 11.5–15.5)
WBC: 5.6 10*3/uL (ref 4.0–10.5)
nRBC: 0 % (ref 0.0–0.2)

## 2020-08-03 LAB — GLUCOSE, CAPILLARY
Glucose-Capillary: 132 mg/dL — ABNORMAL HIGH (ref 70–99)
Glucose-Capillary: 367 mg/dL — ABNORMAL HIGH (ref 70–99)
Glucose-Capillary: 231 mg/dL — ABNORMAL HIGH (ref 70–99)
Glucose-Capillary: 88 mg/dL (ref 70–99)

## 2020-08-03 MED ORDER — MAGNESIUM CITRATE PO SOLN
1.0000 | Freq: Once | ORAL | Status: AC
  Administered 2020-08-03: 1 via ORAL
  Filled 2020-08-03: qty 296

## 2020-08-03 MED ORDER — SIMETHICONE 80 MG PO CHEW
80.0000 mg | CHEWABLE_TABLET | Freq: Four times a day (QID) | ORAL | Status: DC
  Administered 2020-08-03 (×3): 80 mg via ORAL
  Filled 2020-08-03 (×4): qty 1

## 2020-08-03 MED ORDER — INSULIN DETEMIR 100 UNIT/ML ~~LOC~~ SOLN
30.0000 [IU] | Freq: Every day | SUBCUTANEOUS | Status: DC
  Administered 2020-08-04: 30 [IU] via SUBCUTANEOUS
  Filled 2020-08-03: qty 0.3

## 2020-08-03 NOTE — Progress Notes (Signed)
[facility] Progress Note  Patient: [name]    MBW:[id number]  DOA: 07/28/2020     Date of Service: the patient was seen and examined on 08/03/2020  Brief hospital course: Past medical history of breast cancer, HTN, IDDM, hypothyroidism.  Presents with complaints of a fall with left hip pain as well as left chest wall pain found to have transcervical left femoral neck fracture.  SP left total hip replacement on 3/9. Currently plan is monitor postop recovery.  Assessment and Plan: 1.  Left transcervical femoral neck fracture SP left total hip replacement 3/9. Postop pain management, weightbearing, DVT prophylaxis per surgery. Dressing remain in place until follow-up in clinic in 2 weeks. Weightbearing as tolerated on left leg. Aspirin 81 mg for 4 weeks for anticoagulation. Ferrous sulfate 325 mg for 3 weeks. Follow-up with [facility] in 2 weeks. PT OT recommends SNF.  2. Mild renal insufficiency; hyperkalemia; metabolic acidosis  SCr is 1.24 on admission without recent labs for comparison,  serum potassium is 6.3 without EKG changes but specimen was hemolyzed. Patient received treatment for hyperkalemia with Lokelma hold lisinopril-HCTZ. We will stop IV fluid no other than #improved.  3. Insulin-dependent DM  uncontrolled with hyperglycemia. Hemoglobin A1c 9.5. Currently on sliding scale insulin. Resuming home Levemir regimen dose. Monitor.  4. Hypertension  Hold lisinopril and HCTZ in setting of hyperkalemia and hypovolemia   5. Hypothyroidism  Continue Synthroid    6. Anemia likely nutritional chronic Hgb is 10.0 on admission without overt bleeding, was 10-11 range in 2010  Hemoglobin has remained stable for last 2 days 8.8 and 8.7. Currently we will just monitor to get daily.  7. Hyperbilirubinemia Total bilirubin 2.7 on admission  No RUQ tenderness, has been elevated intermittently in the past   8.  Constipation. Continue bowel regimen. Mag  citrate given no improvement so far.  Currently still tolerating p.o. diet and no nausea and vomiting therefore we will continue with bowel regimen by mouth.  Body mass index is 25.93 kg/m.  Nutrition Problem: Increased nutrient needs Etiology: post-op healing Interventions: Interventions: MVI,Glucerna shake  Diet: Cardiac diet DVT Prophylaxis:   SCDs Start: 07/30/20 2034 Place TED hose Start: 07/30/20 2034 SCDs Start: 07/28/20 2251  Advance goals of care discussion: Full code  Family Communication: family was present at bedside, at the time of interview.   Disposition:  Status is: Inpatient  Remains inpatient appropriate because:Ongoing diagnostic testing needed not appropriate for outpatient work up  Dispo: The patient is from: Home              Anticipated d/c is to: SNF              Patient currently is not medically stable to d/c.   Difficult to place patient No  Subjective: Reports constipation.  No nausea no vomiting but no fever no chills.  Physical Exam:  General: Appear in mild distress, no Rash; Oral Mucosa Clear, moist. no Abnormal Neck Mass Or lumps, Conjunctiva normal  Cardiovascular: S1 and S2 Present, no Murmur, Respiratory: good respiratory effort, Bilateral Air entry present and CTA, no Crackles, no wheezes Abdomen: Bowel Sound present, Soft and no tenderness Extremities: no Pedal edema Neurology: alert and oriented to time, place, and person affect appropriate. no new focal deficit Gait not checked due to patient safety concerns  Vitals:   08/03/20 0300 08/03/20 0751 08/03/20 1623 08/03/20 1941  BP: (!) 141/60 122/79 (!) 123/55 134/64  Pulse: 77 74 68 73  Resp: 17  17 17 17   Temp: 98.5 F (36.9 C) 98.4 F (36.9 C) 98.2 F (36.8 C) 97.8 F (36.6 C)  TempSrc: Oral   Oral  SpO2: 99% 97% 97% 99%  Weight:      Height:        Intake/Output Summary (Last 24 hours) at 08/03/2020 2043 Last data filed at 08/03/2020 1500 Gross per 24 hour  Intake 480  ml  Output 300 ml  Net 180 ml   Filed Weights   07/30/20 1413 07/30/20 1714  Weight: 75.1 kg 75.1 kg    Data Reviewed: I have personally reviewed and interpreted daily labs, tele strips, imaging. I reviewed all nursing notes, pharmacy notes, vitals, pertinent old records I have discussed plan of care as described above with RN and patient/family.  CBC: Recent Labs  Lab 07/28/20 2216 07/29/20 0415 07/30/20 0232 07/31/20 0153 08/01/20 0219 08/02/20 0314 08/03/20 0139  WBC 8.1   < > 7.1 8.7 5.7 5.5 5.6  NEUTROABS 6.5  --   --   --   --   --   --   HGB 10.0*   < > 10.7* 9.6* 8.8* 8.7* 8.5*  HCT 30.4*   < > 30.9* 28.0* 24.6* 24.8* 24.9*  MCV 92.7   < > 89.0 91.5 88.5 89.2 89.9  PLT 168   < > 205 187 151 187 222   < > = values in this interval not displayed.   Basic Metabolic Panel: Recent Labs  Lab 07/30/20 0232 07/31/20 0153 08/01/20 0219 08/02/20 0314 08/03/20 0139  NA 139 136 137 138 137  K 3.4* 4.2 3.7 3.9 4.0  CL 105 104 107 105 104  CO2 24 20* 22 28 28   GLUCOSE 71 274* 215* 149* 134*  BUN 13 15 12 12 10   CREATININE 0.97 1.19* 1.04* 0.89 0.94  CALCIUM 8.7* 7.8* 8.1* 8.6* 8.4*    Studies: No results found.  Scheduled Meds: . aspirin  81 mg Oral BID  . atorvastatin  20 mg Oral Daily  . Chlorhexidine Gluconate Cloth  6 each Topical Daily  . ferrous sulfate  325 mg Oral TID PC  . insulin aspart  0-15 Units Subcutaneous TID WC  . insulin aspart  0-5 Units Subcutaneous QHS  . insulin detemir  20 Units Subcutaneous Daily  . levothyroxine  100 mcg Oral Once per day on Mon Wed Fri Sat  . multivitamin with minerals  1 tablet Oral Daily  . polyethylene glycol  17 g Oral BID  . senna-docusate  1 tablet Oral BID  . simethicone  80 mg Oral QID  . Vitamin D (Ergocalciferol)  50,000 Units Oral Q Thu   Continuous Infusions: . methocarbamol (ROBAXIN) IV     PRN Meds: albuterol, alum & mag hydroxide-simeth, bisacodyl, clonazePAM, diphenhydrAMINE,  HYDROcodone-acetaminophen, labetalol, menthol-cetylpyridinium **OR** phenol, methocarbamol **OR** methocarbamol (ROBAXIN) IV, morphine injection, ondansetron **OR** ondansetron (ZOFRAN) IV  Time spent: 35 minutes  Author: Berle Mull, MD [facility] 08/03/2020 8:43 PM  To reach On-call, see care teams to locate the attending and reach out via [email or web address]. Between 7PM-7AM, please contact night-coverage If you still have difficulty reaching the attending provider, please page the [facility] (Director on Call) for [facility] on amion for assistance.

## 2020-08-03 NOTE — Plan of Care (Signed)
No acute changes since the previous night that I took care of her. Discharge plan is to SNF -waiting for bed offers. NAD or needs voiced. Will continue to monitor and continue current POC.

## 2020-08-03 NOTE — Progress Notes (Signed)
Physical Therapy Treatment Patient Details Name: [name] MRN: [id number] DOB: 1945-08-30 Today's Date: 08/03/2020    History of Present Illness 75 y.o. female admitted on 07/28/20 s/p fall with resultant LL hip fx and sternal bruising, mild renal insufficiency, hyperkalemia, metabolic acidosis.  Pt s/p L direct ant THA on 07/30/20.  Pt with significant PMH of Essential HTN, DM2, CA (breast) s/p mastectomy.    PT Comments    Pt making steady progress. Continue to recommend SNF for short term rehab. Pt in agreement with this.     Follow Up Recommendations  SNF     Equipment Recommendations  Rolling walker with 5" wheels;3in1 (PT)    Recommendations for Other Services       Precautions / Restrictions Precautions Precautions: Fall Restrictions Weight Bearing Restrictions: Yes LLE Weight Bearing: Weight bearing as tolerated    Mobility  Bed Mobility Overal bed mobility: Needs Assistance Bed Mobility: Supine to Sit     Supine to sit: Mod assist;HOB elevated     General bed mobility comments: pt with difficulty managing her upper body due to pain in sternum.    Transfers Overall transfer level: Needs assistance Equipment used: Rolling walker (2 wheeled) Transfers: Sit to/from Stand Sit to Stand: Min assist         General transfer comment: because of sternal pain pt used 1 hand on RW with getting to standing.  Ambulation/Gait Ambulation/Gait assistance: Min assist Gait Distance (Feet): 20 Feet Assistive device: Rolling walker (2 wheeled) Gait Pattern/deviations: Step-to pattern;Antalgic     General Gait Details: Cues for correct LE sequencing.  Followed with chair   Stairs             Wheelchair Mobility    Modified Rankin (Stroke Patients Only)       Balance Overall balance assessment: Needs assistance                                          Cognition Arousal/Alertness: Awake/alert Behavior During Therapy: WFL for  tasks assessed/performed Overall Cognitive Status: Within Functional Limits for tasks assessed                                        Exercises Total Joint Exercises Ankle Circles/Pumps: Other (comment) (educated pt to do these throughout the day) Heel Slides: AAROM;Left;10 reps Hip ABduction/ADduction: AAROM;Left;10 reps    General Comments General comments (skin integrity, edema, etc.): no family present.      Pertinent Vitals/Pain Pain Assessment: 0-10 Pain Score: 6  Pain Location: left hip, sternum Pain Descriptors / Indicators: Grimacing;Guarding Pain Intervention(s): Limited activity within patient's tolerance;Patient requesting pain meds-RN notified    Home Living                      Prior Function            PT Goals (current goals can now be found in the care plan section) Progress towards PT goals: Progressing toward goals    Frequency    Min 5X/week      PT Plan Current plan remains appropriate    Co-evaluation              AM-PAC PT "6 Clicks" Mobility   Outcome Measure  Help needed turning from your back  to your side while in a flat bed without using bedrails?: A Lot Help needed moving from lying on your back to sitting on the side of a flat bed without using bedrails?: A Lot Help needed moving to and from a bed to a chair (including a wheelchair)?: A Little Help needed standing up from a chair using your arms (e.g., wheelchair or bedside chair)?: A Little Help needed to walk in hospital room?: A Little Help needed climbing 3-5 steps with a railing? : A Lot 6 Click Score: 15    End of Session Equipment Utilized During Treatment: Gait belt Activity Tolerance: Patient limited by pain Patient left: in chair;with call bell/phone within reach;with chair alarm set;with nursing/sitter in room Nurse Communication: Mobility status PT Visit Diagnosis: Muscle weakness (generalized) (M62.81);Difficulty in walking, not  elsewhere classified (R26.2);Pain Pain - Right/Left: Left Pain - part of body: Hip     Time: 6808-8110 PT Time Calculation (min) (ACUTE ONLY): 17 min  Charges:  $Gait Training: 8-22 mins                     Lavonia Dana, [place]  Pager [phone] Office [phone] 08/03/2020    Melvern Banker 08/03/2020, 11:00 AM

## 2020-08-03 NOTE — TOC Progression Note (Addendum)
Transition of Care [facility]) - Progression Note    Patient Details  Name: DINARA LUPU MRN: [id number] Date of Birth: 1945/06/08  Transition of Care [facility]) CM/SW [place], [place] Phone Number: 08/03/2020, 11:14 AM  Clinical Narrative:     CSW called and left voicemail for Graceann with [facility] to see if she can offer SNF bed for patient. CSW awaiting callback.   CSW will continue to follow.    Expected Discharge Plan: [place] Barriers to Discharge: [place] Work up,SNF Pending bed offer  Expected Discharge Plan and Services Expected Discharge Plan: [place] In-house Referral: Clinical Social Work     Living arrangements for the past 2 months: Single Family Home                                       Social Determinants of Health (SDOH) Interventions    Readmission Risk Interventions No flowsheet data found.

## 2020-08-04 DIAGNOSIS — D649 Anemia, unspecified: Secondary | ICD-10-CM | POA: Diagnosis not present

## 2020-08-04 DIAGNOSIS — M2550 Pain in unspecified joint: Secondary | ICD-10-CM | POA: Diagnosis not present

## 2020-08-04 DIAGNOSIS — Z743 Need for continuous supervision: Secondary | ICD-10-CM | POA: Diagnosis not present

## 2020-08-04 DIAGNOSIS — R945 Abnormal results of liver function studies: Secondary | ICD-10-CM | POA: Diagnosis not present

## 2020-08-04 DIAGNOSIS — E785 Hyperlipidemia, unspecified: Secondary | ICD-10-CM | POA: Diagnosis not present

## 2020-08-04 DIAGNOSIS — Z7401 Bed confinement status: Secondary | ICD-10-CM | POA: Diagnosis not present

## 2020-08-04 DIAGNOSIS — N289 Disorder of kidney and ureter, unspecified: Secondary | ICD-10-CM | POA: Diagnosis not present

## 2020-08-04 DIAGNOSIS — R42 Dizziness and giddiness: Secondary | ICD-10-CM | POA: Diagnosis not present

## 2020-08-04 DIAGNOSIS — R5381 Other malaise: Secondary | ICD-10-CM | POA: Diagnosis not present

## 2020-08-04 DIAGNOSIS — E876 Hypokalemia: Secondary | ICD-10-CM | POA: Diagnosis not present

## 2020-08-04 DIAGNOSIS — K5900 Constipation, unspecified: Secondary | ICD-10-CM | POA: Diagnosis not present

## 2020-08-04 DIAGNOSIS — E119 Type 2 diabetes mellitus without complications: Secondary | ICD-10-CM | POA: Diagnosis not present

## 2020-08-04 DIAGNOSIS — R0789 Other chest pain: Secondary | ICD-10-CM | POA: Diagnosis not present

## 2020-08-04 DIAGNOSIS — Z471 Aftercare following joint replacement surgery: Secondary | ICD-10-CM | POA: Diagnosis not present

## 2020-08-04 DIAGNOSIS — E039 Hypothyroidism, unspecified: Secondary | ICD-10-CM | POA: Diagnosis not present

## 2020-08-04 DIAGNOSIS — I10 Essential (primary) hypertension: Secondary | ICD-10-CM | POA: Diagnosis not present

## 2020-08-04 DIAGNOSIS — S72002A Fracture of unspecified part of neck of left femur, initial encounter for closed fracture: Secondary | ICD-10-CM | POA: Diagnosis not present

## 2020-08-04 DIAGNOSIS — E875 Hyperkalemia: Secondary | ICD-10-CM | POA: Diagnosis not present

## 2020-08-04 DIAGNOSIS — E872 Acidosis: Secondary | ICD-10-CM | POA: Diagnosis not present

## 2020-08-04 LAB — CBC
HCT: 25.2 % — ABNORMAL LOW (ref 36.0–46.0)
Hemoglobin: 8.4 g/dL — ABNORMAL LOW (ref 12.0–15.0)
MCH: 30.3 pg (ref 26.0–34.0)
MCHC: 33.3 g/dL (ref 30.0–36.0)
MCV: 91 fL (ref 80.0–100.0)
Platelets: 267 10*3/uL (ref 150–400)
RBC: 2.77 MIL/uL — ABNORMAL LOW (ref 3.87–5.11)
RDW: 12.9 % (ref 11.5–15.5)
WBC: 6.3 10*3/uL (ref 4.0–10.5)
nRBC: 0 % (ref 0.0–0.2)

## 2020-08-04 LAB — BASIC METABOLIC PANEL
Anion gap: 6 (ref 5–15)
BUN: 13 mg/dL (ref 8–23)
CO2: 30 mmol/L (ref 22–32)
Calcium: 8.8 mg/dL — ABNORMAL LOW (ref 8.9–10.3)
Chloride: 102 mmol/L (ref 98–111)
Creatinine, Ser: 1.01 mg/dL — ABNORMAL HIGH (ref 0.44–1.00)
GFR, Estimated: 57 mL/min — ABNORMAL LOW (ref >60)
Glucose, Bld: 98 mg/dL (ref 70–99)
Potassium: 4.8 mmol/L (ref 3.5–5.1)
Sodium: 138 mmol/L (ref 135–145)

## 2020-08-04 LAB — GLUCOSE, CAPILLARY
Glucose-Capillary: 89 mg/dL (ref 70–99)
Glucose-Capillary: 273 mg/dL — ABNORMAL HIGH (ref 70–99)
Glucose-Capillary: 187 mg/dL — ABNORMAL HIGH (ref 70–99)

## 2020-08-04 LAB — RESP PANEL BY RT-PCR (FLU A&B, COVID) ARPGX2
Influenza A by PCR: NEGATIVE
Influenza B by PCR: NEGATIVE
SARS Coronavirus 2 by RT PCR: NEGATIVE

## 2020-08-04 MED ORDER — CLONAZEPAM 0.5 MG PO TABS: 0.2500 mg | ORAL_TABLET | Freq: Two times a day (BID) | ORAL | 0 refills | Status: AC | PRN

## 2020-08-04 MED ORDER — SIMETHICONE 80 MG PO CHEW: 80.0000 mg | CHEWABLE_TABLET | Freq: Four times a day (QID) | ORAL | 0 refills | Status: AC

## 2020-08-04 MED ORDER — POLYETHYLENE GLYCOL 3350 17 G PO PACK: 17.0000 g | PACK | Freq: Two times a day (BID) | ORAL | 0 refills | Status: AC

## 2020-08-04 MED ORDER — DOCUSATE SODIUM 100 MG PO CAPS: 100.0000 mg | ORAL_CAPSULE | Freq: Two times a day (BID) | ORAL | 2 refills | Status: AC

## 2020-08-04 MED ORDER — ADULT MULTIVITAMIN W/MINERALS CH: 1.0000 | ORAL_TABLET | Freq: Every day | ORAL | 0 refills | Status: AC

## 2020-08-04 MED ORDER — FERROUS SULFATE 325 (65 FE) MG PO TABS: 325.0000 mg | ORAL_TABLET | Freq: Three times a day (TID) | ORAL | 0 refills | Status: AC

## 2020-08-04 MED ORDER — CLONAZEPAM 0.5 MG PO TABS: 0.2500 mg | ORAL_TABLET | Freq: Two times a day (BID) | ORAL | 0 refills | Status: DC | PRN

## 2020-08-04 NOTE — Progress Notes (Signed)
Insurance auth. Received from [facility] , [id number]. Start date 3/16, review 3/16. Alison Murray CM. Whitman Hero RN,CM

## 2020-08-04 NOTE — Care Management Important Message (Signed)
Important Message  Patient Details  Name: [name] MRN: [id number] Date of Birth: 1945-10-14   Medicare Important Message Given:  Yes - Important Message mailed due to current National Emergency  Verbal consent obtained due to current National Emergency  Relationship to patient: Self Contact Name: Morrison Masser Call Date: 08/04/20  Time: 6440 Phone: [phone] Outcome: Spoke with contact Important Message mailed to: Patient address on file    Delorse Lek 08/04/2020, 2:26 PM

## 2020-08-04 NOTE — Progress Notes (Signed)
Called facility and gave report to [facility], all questions and concerns addressed, Pt not in distress, discharged to facility via PTAR with belongings. Daughter at bedside updated.

## 2020-08-04 NOTE — TOC Transition Note (Signed)
Transition of Care [facility]) - CM/SW Discharge Note   Patient Details  Name: [name] MRN: [id number] Date of Birth: 09-03-1945  Transition of Care [facility]) CM/SW Contact:  Coralee Pesa, [place] Phone Number: 08/04/2020, 1:27 PM   Clinical Narrative:    Pt to be transferred by PTAR to [facility]). Nurse to call report to [phone]. Pt to rm# [phone].   Final next level of care: Skilled Nursing Facility Barriers to Discharge: Barriers Resolved   Patient Goals and CMS Choice        Discharge Placement              Patient chooses bed at: [facility] Patient to be transferred to facility by: [place] Name of family member notified: Judeen Hammans Patient and family notified of of transfer: 08/04/20  Discharge Plan and Services In-house Referral: Clinical Social Work                                   Social Determinants of Health ([place]) Interventions     Readmission Risk Interventions No flowsheet data found.

## 2020-08-04 NOTE — Plan of Care (Signed)

## 2020-08-04 NOTE — Progress Notes (Signed)
PT Cancellation Note  Patient Details Name: [name] MRN: [id number] DOB: 1945-12-09   Cancelled Treatment:    Reason Eval/Treat Not Completed: (P) Other (comment) (Pt to d/c to snf will defer PT needs to next level of care.)   Terion Hedman Eli Hose 08/04/2020, 2:08 PM  Erasmo Leventhal , PTA Acute Rehabilitation Services Pager [phone] Office [phone]

## 2020-08-04 NOTE — TOC Progression Note (Addendum)
Transition of Care [facility]) - Progression Note    Patient Details  Name: ANHAR MCDERMOTT MRN: [id number] Date of Birth: 1945/08/03  Transition of Care [facility]) CM/SW [place], [place] Phone Number: 08/04/2020, 12:29 PM  Clinical Narrative:    CSW spoke with facility this morning to follow up on referral. Facility noted they could accept pt today with a negative covid test. Test was ordered. Will follow for a negative result and DC needs.  Test is negative, can DC with summary and orders.   Expected Discharge Plan: [place] Barriers to Discharge: [place] Work up,SNF Pending bed offer  Expected Discharge Plan and Services Expected Discharge Plan: [place] In-house Referral: Clinical Social Work     Living arrangements for the past 2 months: Single Family Home                                       Social Determinants of Health (SDOH) Interventions    Readmission Risk Interventions No flowsheet data found.

## 2020-08-04 NOTE — Discharge Summary (Signed)
[facility] Discharge Summary   Patient: [name] OAC:[id number]  PCP: Carolee Rota, NP  Date of admission: 07/28/2020   Date of discharge:  08/04/2020     Discharge Diagnoses:  Principal Problem:   Closed left hip fracture, initial encounter [facility]) Active Problems:   Mild renal insufficiency   Hyperkalemia   Metabolic acidosis   Diabetes mellitus, type II, insulin dependent ([place])   Essential hypertension   Hypothyroidism, unspecified   Normochromic anemia   Admitted From: home Disposition:  SNF   Recommendations for Outpatient Follow-up:  1. PCP: please follow up with PCP and Orthopedics as recommended 2. Follow up LABS/TEST:  none   Follow-up Information    Paralee Cancel, MD. Schedule an appointment as soon as possible for a visit in 2 weeks.   Specialty: Orthopedic Surgery Contact information: [address] [place] [address] [phone]        Carolee Rota, NP. Schedule an appointment as soon as possible for a visit in 1 week(s).   Specialty: Nurse Practitioner Contact information: [place] [address] [phone]              Discharge Instructions    Diet - low sodium heart healthy   Complete by: As directed    Increase activity slowly   Complete by: As directed    Leave dressing on - Keep it clean, dry, and intact until clinic visit   Complete by: As directed    Weight bearing as tolerated   Complete by: As directed    Laterality: left   Extremity: Lower      Diet recommendation: Cardiac diet  Activity: The patient is advised to gradually reintroduce usual activities, as tolerated  Discharge Condition: stable  Code Status: Full code   History of present illness: As per the H and P dictated on admission, "[name] is a 75 y.o. female with medical history significant for breast cancer more than 30 years ago, hypertension, insulin-dependent diabetes mellitus, and hypothyroidism, who presents  with severe pain involving the left hip and left chest wall after a mechanical fall at home on 07/26/2020.  Patient reports that she had a mild respiratory illness approximately 1 month ago, had completely recovered from that and had been in her usual state of health when she tripped in her yard, landing on her left side.  She was going up one step onto a wooden deck when she tripped and believes that she impacted her chest on the edge of that deck.  She had immediate and severe pain in her chest and left hip and was unable to stand.  She describes dragging herself across her yard to her house where she was able to reach a grandson who has been assisting her.  She has been unable to bear weight on the left leg since the time of injury, severe pain has persisted, and so she eventually came into the ED for evaluation today.  She denies any of her head or losing consciousness.  She denies any fevers, chills, cough, or shortness of breath.  She describes herself as normally healthy and is active doing yard work and caring for a 31-year-old relative.  She is able to ascend a flight of stairs without stopping and never experiences chest pain with activity.  ED Course: Upon arrival to the ED, patient is found to be afebrile, saturating well on room air, and with blood pressure 178/66.  EKG features sinus rhythm.  Chest x-ray  negative for acute cardiopulmonary disease.  Dedicated plain films of the left ribs are negative for acute fracture.  X-ray of the left hip and pelvis reveals transcervical left femoral neck fracture.  Chemistry panel notable for glucose 265, potassium 6.3, bicarbonate 15, anion gap 16, creatinine 1.24, AST 51, and bilirubin 2.7.  CBC notable for a normocytic anemia with hemoglobin 10.0.  Patient was given 500 cc of saline and 5 mg Norco in the ED.  Orthopedic surgery was consulted by the ED physician.  COVID-19 screening test not yet resulted."  Hospital Course:  Summary of her active problems in  the hospital is as following.   1.  Left transcervical femoral neck fracture SP left total hip replacement 3/9. Postop pain management, weightbearing, DVT prophylaxis per surgery. Dressing remain in place until follow-up in clinic in 2 weeks. Weightbearing as tolerated on left leg. Aspirin 81 mg for 4 weeks for anticoagulation. Ferrous sulfate 325 mg for 3 weeks. Follow-up with [facility] in 2 weeks. PT OT recommends SNF.  2.Mild renal insufficiency; hyperkalemia; metabolic acidosis SCr is 3.47 on admission without recent labs for comparison,  serum potassium is 6.3 without EKG changes but specimen was hemolyzed. Patient received treatment for hyperkalemia with Lokelma hold lisinopril-HCTZ. We will stop IV fluid no other than #improved.  3.Insulin-dependent DM uncontrolled with hyperglycemia. Hemoglobin A1c 9.5. Currently on sliding scale insulin. Resuming home Levemir regimen dose. Monitor.  4.Hypertension Hold lisinopril and HCTZ in setting of hyperkalemia and hypovolemia  5.Hypothyroidism Continue Synthroid  6.Anemialikely nutritional chronic Hgb is 10.0 on admission without overt bleeding, was 10-11 range in 2010 Hemoglobin has remained stable for last 2 days 8.8 and 8.7. Currently we will just monitor to get daily.  7.Hyperbilirubinemia Total bilirubin 2.7 on admission No RUQ tenderness, has been elevated intermittently in the past  8.  Constipation. Continue bowel regimen. Mag citrate given no improvement so far.  Currently still tolerating p.o. diet and no nausea and vomiting therefore we will continue with bowel regimen by mouth.  Body mass index is 25.93 kg/m.  Nutrition Problem: Increased nutrient needs Etiology: post-op healing Nutrition Interventions: Interventions: MVI,Glucerna shake  Pain control  - [place] Controlled Substance Reporting System database was reviewed. - 5 day supply was provided. - Patient was  instructed, not to drive, operate heavy machinery, perform activities at heights, swimming or participation in water activities or provide baby sitting services while on Pain, Sleep and Anxiety Medications; until her outpatient Physician has advised to do so again.  - Also recommended to not to take more than prescribed Pain, Sleep and Anxiety Medications.  Patient was seen by physical therapy, who recommended SNF. On the day of the discharge the patient's vitals were stable, and no other acute medical condition were reported by patient. The patient was felt safe to be discharge at SNF with Therapy.  Consultants: Orthopedics  Procedures: Left total hip replacement through an anterior approach   utilizing DePuy THR system  DISCHARGE MEDICATION: Allergies as of 08/04/2020      Reactions   Calcium    Other reaction(s): nausea   Sulfa Antibiotics Nausea And Vomiting      Medication List    TAKE these medications   Accu-Chek Aviva Plus test strip Generic drug: glucose blood 1 each by Other route daily.   acetaminophen 500 MG tablet Commonly known as: TYLENOL Take 1,000 mg by mouth every 6 (six) hours as needed for moderate pain or headache.   albuterol 108 (90 Base) MCG/ACT  inhaler Commonly known as: VENTOLIN HFA Inhale 2 puffs into the lungs every 6 (six) hours as needed for wheezing or shortness of breath.   aspirin 81 MG chewable tablet Commonly known as: Aspirin Childrens Chew 1 tablet (81 mg total) by mouth 2 (two) times daily. Take for 4 weeks, then resume regular dose.   atorvastatin 20 MG tablet Commonly known as: LIPITOR Take 20 mg by mouth daily.   clonazePAM 0.5 MG tablet Commonly known as: KLONOPIN Take 0.5-1 tablets (0.25-0.5 mg total) by mouth 2 (two) times daily as needed for anxiety.   docusate sodium 100 MG capsule Commonly known as: Colace Take 1 capsule (100 mg total) by mouth 2 (two) times daily.   ferrous sulfate 325 (65 FE) MG tablet Take 1 tablet  (325 mg total) by mouth 3 (three) times daily after meals.   HYDROcodone-acetaminophen 5-325 MG tablet Commonly known as: Norco Take 1-2 tablets by mouth every 6 (six) hours as needed for moderate pain or severe pain.   Levemir FlexTouch 100 UNIT/ML FlexPen Generic drug: insulin detemir Inject 32 Units into the skin daily.   levothyroxine 100 MCG tablet Commonly known as: SYNTHROID Take 100 mcg by mouth See admin instructions. Monday,wednesday,friday,saturday   lisinopril-hydrochlorothiazide 10-12.5 MG tablet Commonly known as: ZESTORETIC Take 1 tablet by mouth daily.   loratadine 10 MG tablet Commonly known as: CLARITIN Take 10 mg by mouth daily as needed for allergies.   meclizine 25 MG tablet Commonly known as: ANTIVERT Take 25 mg by mouth daily as needed for dizziness.   metFORMIN 750 MG 24 hr tablet Commonly known as: GLUCOPHAGE-XR Take 1,500 mg by mouth every evening.   methocarbamol 500 MG tablet Commonly known as: Robaxin Take 1 tablet (500 mg total) by mouth every 6 (six) hours as needed for muscle spasms.   multivitamin with minerals Tabs tablet Take 1 tablet by mouth daily. Start taking on: August 05, 2020   NovoFine Autocover Pen Needle 30G X 8 MM Misc Generic drug: Insulin Pen Needle 1 each daily.   polyethylene glycol 17 g packet Commonly known as: MIRALAX / GLYCOLAX Take 17 g by mouth 2 (two) times daily.   simethicone 80 MG chewable tablet Commonly known as: MYLICON Chew 1 tablet (80 mg total) by mouth 4 (four) times daily.   trimethoprim 100 MG tablet Commonly known as: TRIMPEX Take 100 mg by mouth 2 (two) times daily as needed (bladder infection).            Discharge Care Instructions  (From admission, onward)         Start     Ordered   08/04/20 0000  Leave dressing on - Keep it clean, dry, and intact until clinic visit        08/04/20 1300   07/31/20 0000  Weight bearing as tolerated       Question Answer Comment  Laterality  left   Extremity Lower      07/31/20 1312          Discharge Exam: [facility] Weights   07/30/20 1413 07/30/20 1714  Weight: 75.1 kg 75.1 kg   Vitals:   08/03/20 1941 08/04/20 0744  BP: 134/64 (!) 143/57  Pulse: 73 67  Resp: 17 16  Temp: 97.8 F (36.6 C) 98.8 F (37.1 C)  SpO2: 99% 98%   General: Appear in no distress, no Rash; Oral Mucosa Clear, moist. no Abnormal Neck Mass Or lumps, Conjunctiva normal  Cardiovascular: S1 and S2 Present, aortic systolic  Murmur Respiratory: good respiratory effort, Bilateral Air entry present and CTA, no Crackles, no wheezes Abdomen: Bowel Sound present, Soft and no tenderness Extremities: no Pedal edema Neurology: alert and oriented to time, place, and person affect appropriate. no new focal deficit  The results of significant diagnostics from this hospitalization (including imaging, microbiology, ancillary and laboratory) are listed below for reference.    Significant Diagnostic Studies: DG Chest 2 View  Result Date: 07/28/2020 CLINICAL DATA:  Post fall with hip pain.  Chest wall pain. EXAM: CHEST - 2 VIEW COMPARISON:  09/14/2017 FINDINGS: Stable heart size and mediastinal contours. Stable upper normal heart size. The lungs are clear. Pulmonary vasculature is normal. No consolidation, pleural effusion, or pneumothorax. Left ribs are better assessed on dedicated rib films, reported separately. No acute osseous abnormalities are seen. Multiple surgical clips overlie the chest wall. IMPRESSION: No acute chest findings. Electronically Signed   By: Keith Rake M.D.   On: 07/28/2020 21:04   DG Ribs Unilateral Left  Result Date: 07/28/2020 CLINICAL DATA:  Fall with chest wall pain. EXAM: LEFT RIBS - 2 VIEW COMPARISON:  Concurrent chest radiograph. FINDINGS: Cortical margins of the left ribs are intact. No fracture or other bone lesions are seen involving the ribs. IMPRESSION: No fracture of left ribs. Electronically Signed   By: Keith Rake  M.D.   On: 07/28/2020 21:08   DG Sternum  Result Date: 08/02/2020 CLINICAL DATA:  Chest pain. EXAM: STERNUM - 2+ VIEW COMPARISON:  Chest radiograph-07/28/2020 FINDINGS: Grossly unchanged enlarged cardiac silhouette and mediastinal contours. Left basilar/retrocardiac linear heterogeneous opacities are unchanged favored to represent atelectasis. No new focal airspace opacities. No pleural effusion or pneumothorax. No evidence of edema. No definite displaced sternal fracture. Sequela of previous mastectomy and bilateral axillary adenectomy. No radiopaque foreign body. IMPRESSION: 1. No definite displaced sternal fracture 2. Similar findings of cardiomegaly and left basilar atelectasis without superimposed acute cardiopulmonary disease. Electronically Signed   By: Sandi Mariscal M.D.   On: 08/02/2020 13:31   DG Pelvis Portable  Result Date: 07/30/2020 CLINICAL DATA:  Postop left hip replacement. EXAM: PORTABLE PELVIS 1-2 VIEWS COMPARISON:  Preoperative radiograph 07/28/2020 FINDINGS: Left hip arthroplasty in expected alignment. No periprosthetic lucency. Recent postsurgical change includes air and edema in the joint space and soft tissues. Pubic rami are intact. IMPRESSION: Left hip arthroplasty without immediate postoperative complication. Electronically Signed   By: Keith Rake M.D.   On: 07/30/2020 19:40   DG Knee Left Port  Result Date: 07/28/2020 CLINICAL DATA:  Left leg pain following fall, initial encounter EXAM: PORTABLE LEFT KNEE - 2 VIEW COMPARISON:  None. FINDINGS: Degenerative changes are noted in the left knee joint primarily in the patellofemoral and medial joint space. No acute fracture or dislocation is noted. No soft tissue abnormality is seen. IMPRESSION: Degenerative change without acute abnormality. Electronically Signed   By: Inez Catalina M.D.   On: 07/28/2020 23:08   DG Abd Portable 1V  Result Date: 08/02/2020 CLINICAL DATA:  Fall with left hip and chest pain. EXAM: PORTABLE ABDOMEN  - 1 VIEW COMPARISON:  August 10, 2008 FINDINGS: Hernia mash is at identified. The bowel gas pattern is nonobstructive. The patient is status post left hip replacement. No acute abnormalities identified. IMPRESSION: No acute abnormalities identified. Electronically Signed   By: Dorise Bullion III M.D   On: 08/02/2020 11:55   DG C-Arm 1-60 Min  Result Date: 07/30/2020 CLINICAL DATA:  Left hip replacement. EXAM: OPERATIVE LEFT HIP (WITH PELVIS IF PERFORMED)  TECHNIQUE: Fluoroscopic spot image(s) were submitted for interpretation post-operatively. COMPARISON:  Preoperative radiograph 07/28/2020 FINDINGS: Two fluoroscopic spot views obtained in the operating room during left hip arthroplasty. Total fluoroscopy time 8 seconds. Total dose 0.844 mGy. IMPRESSION: Procedural fluoroscopy during left hip arthroplasty. Electronically Signed   By: Keith Rake M.D.   On: 07/30/2020 20:18   DG HIP OPERATIVE UNILAT W OR W/O PELVIS LEFT  Result Date: 07/30/2020 CLINICAL DATA:  Left hip replacement. EXAM: OPERATIVE LEFT HIP (WITH PELVIS IF PERFORMED) TECHNIQUE: Fluoroscopic spot image(s) were submitted for interpretation post-operatively. COMPARISON:  Preoperative radiograph 07/28/2020 FINDINGS: Two fluoroscopic spot views obtained in the operating room during left hip arthroplasty. Total fluoroscopy time 8 seconds. Total dose 0.844 mGy. IMPRESSION: Procedural fluoroscopy during left hip arthroplasty. Electronically Signed   By: Keith Rake M.D.   On: 07/30/2020 20:18   DG Hip Unilat With Pelvis 2-3 Views Left  Result Date: 07/28/2020 CLINICAL DATA:  Fall EXAM: DG HIP (WITH OR WITHOUT PELVIS) 2-3V LEFT COMPARISON:  Acute abdominal series 08/10/2008 FINDINGS: There is a transcervical left femoral neck fracture with foreshortening across the fracture line. Questionable fragmentation versus enthesopathic change involving the greater trochanter as well. Surrounding soft tissue swelling is noted. No other acute fracture  or traumatic osseous injury is seen involving the bones of the pelvis or the contralateral proximal right femur. Femoral heads remain normally located. Degenerative changes in the spine, hips and pelvis. Prior right lower abdominal hernia mesh repair. Additional surgical clips in the pelvis. IMPRESSION: 1. Transcervical left femoral neck fracture with foreshortening across the fracture line. 2. Questionable fragmentation versus enthesopathic change involving the left greater trochanter as well. Electronically Signed   By: Lovena Le M.D.   On: 07/28/2020 21:09    Microbiology: Recent Results (from the past 240 hour(s))  Resp Panel by RT-PCR (Flu A&B, Covid) Nasopharyngeal Swab     Status: None   Collection Time: 07/28/20  9:32 PM   Specimen: Nasopharyngeal Swab; Nasopharyngeal(NP) swabs in vial transport medium  Result Value Ref Range Status   SARS Coronavirus 2 by RT PCR NEGATIVE NEGATIVE Final    Comment: (NOTE) SARS-CoV-2 target nucleic acids are NOT DETECTED.  The SARS-CoV-2 RNA is generally detectable in upper respiratory specimens during the acute phase of infection. The lowest concentration of SARS-CoV-2 viral copies this assay can detect is 138 copies/mL. A negative result does not preclude SARS-Cov-2 infection and should not be used as the sole basis for treatment or other patient management decisions. A negative result may occur with  improper specimen collection/handling, submission of specimen other than nasopharyngeal swab, presence of viral mutation(s) within the areas targeted by this assay, and inadequate number of viral copies(<138 copies/mL). A negative result must be combined with clinical observations, patient history, and epidemiological information. The expected result is Negative.  Fact Sheet for Patients:  EntrepreneurPulse.com.au  Fact Sheet for Healthcare Providers:  IncredibleEmployment.be  This test is no t yet approved  or cleared by the [place] FDA and  has been authorized for detection and/or diagnosis of SARS-CoV-2 by FDA under an Emergency Use Authorization (EUA). This EUA will remain  in effect (meaning this test can be used) for the duration of the COVID-19 declaration under Section 564(b)(1) of the Act, 21 U.S.C.section 360bbb-3(b)(1), unless the authorization is terminated  or revoked sooner.       Influenza A by PCR NEGATIVE NEGATIVE Final   Influenza B by PCR NEGATIVE NEGATIVE Final    Comment: (NOTE) The Xpert  Xpress SARS-CoV-2/FLU/RSV plus assay is intended as an aid in the diagnosis of influenza from Nasopharyngeal swab specimens and should not be used as a sole basis for treatment. Nasal washings and aspirates are unacceptable for Xpert Xpress SARS-CoV-2/FLU/RSV testing.  Fact Sheet for Patients: EntrepreneurPulse.com.au  Fact Sheet for Healthcare Providers: IncredibleEmployment.be  This test is not yet approved or cleared by the [place] FDA and has been authorized for detection and/or diagnosis of SARS-CoV-2 by FDA under an Emergency Use Authorization (EUA). This EUA will remain in effect (meaning this test can be used) for the duration of the COVID-19 declaration under Section 564(b)(1) of the Act, 21 U.S.C. section 360bbb-3(b)(1), unless the authorization is terminated or revoked.  Performed at [facility], [place] [address] [place], [place] [address]   Surgical pcr screen     Status: None   Collection Time: 07/29/20  6:22 PM   Specimen: Nasal Mucosa; Nasal Swab  Result Value Ref Range Status   MRSA, PCR NEGATIVE NEGATIVE Final   Staphylococcus aureus NEGATIVE NEGATIVE Final    Comment: (NOTE) The Xpert SA Assay (FDA approved for NASAL specimens in patients 38 years of age and older), is one component of a comprehensive surveillance program. It is not intended to diagnose infection nor to guide or monitor  treatment. Performed at [facility], [place] [address], [place], [place] [address]   Resp Panel by RT-PCR (Flu A&B, Covid) Nasopharyngeal Swab     Status: None   Collection Time: 08/04/20  9:33 AM   Specimen: Nasopharyngeal Swab; Nasopharyngeal(NP) swabs in vial transport medium  Result Value Ref Range Status   SARS Coronavirus 2 by RT PCR NEGATIVE NEGATIVE Final    Comment: (NOTE) SARS-CoV-2 target nucleic acids are NOT DETECTED.  The SARS-CoV-2 RNA is generally detectable in upper respiratory specimens during the acute phase of infection. The lowest concentration of SARS-CoV-2 viral copies this assay can detect is 138 copies/mL. A negative result does not preclude SARS-Cov-2 infection and should not be used as the sole basis for treatment or other patient management decisions. A negative result may occur with  improper specimen collection/handling, submission of specimen other than nasopharyngeal swab, presence of viral mutation(s) within the areas targeted by this assay, and inadequate number of viral copies(<138 copies/mL). A negative result must be combined with clinical observations, patient history, and epidemiological information. The expected result is Negative.  Fact Sheet for Patients:  EntrepreneurPulse.com.au  Fact Sheet for Healthcare Providers:  IncredibleEmployment.be  This test is no t yet approved or cleared by the [place] FDA and  has been authorized for detection and/or diagnosis of SARS-CoV-2 by FDA under an Emergency Use Authorization (EUA). This EUA will remain  in effect (meaning this test can be used) for the duration of the COVID-19 declaration under Section 564(b)(1) of the Act, 21 U.S.C.section 360bbb-3(b)(1), unless the authorization is terminated  or revoked sooner.       Influenza A by PCR NEGATIVE NEGATIVE Final   Influenza B by PCR NEGATIVE NEGATIVE Final    Comment: (NOTE) The Xpert Xpress  SARS-CoV-2/FLU/RSV plus assay is intended as an aid in the diagnosis of influenza from Nasopharyngeal swab specimens and should not be used as a sole basis for treatment. Nasal washings and aspirates are unacceptable for Xpert Xpress SARS-CoV-2/FLU/RSV testing.  Fact Sheet for Patients: EntrepreneurPulse.com.au  Fact Sheet for Healthcare Providers: IncredibleEmployment.be  This test is not yet approved or cleared by the [place] and has been authorized  for detection and/or diagnosis of SARS-CoV-2 by FDA under an Emergency Use Authorization (EUA). This EUA will remain in effect (meaning this test can be used) for the duration of the COVID-19 declaration under Section 564(b)(1) of the Act, 21 U.S.C. section 360bbb-3(b)(1), unless the authorization is terminated or revoked.  Performed at [facility], [place] [address] [place], [place] [address]      Labs: CBC: Recent Labs  Lab 07/28/20 2216 07/29/20 0415 07/31/20 0153 08/01/20 0219 08/02/20 0314 08/03/20 0139 08/04/20 0342  WBC 8.1   < > 8.7 5.7 5.5 5.6 6.3  NEUTROABS 6.5  --   --   --   --   --   --   HGB 10.0*   < > 9.6* 8.8* 8.7* 8.5* 8.4*  HCT 30.4*   < > 28.0* 24.6* 24.8* 24.9* 25.2*  MCV 92.7   < > 91.5 88.5 89.2 89.9 91.0  PLT 168   < > 187 151 187 222 267   < > = values in this interval not displayed.   Basic Metabolic Panel: Recent Labs  Lab 07/31/20 0153 08/01/20 0219 08/02/20 0314 08/03/20 0139 08/04/20 0342  NA 136 137 138 137 138  K 4.2 3.7 3.9 4.0 4.8  CL 104 107 105 104 102  CO2 20* 22 28 28 30   GLUCOSE 274* 215* 149* 134* 98  BUN 15 12 12 10 13   CREATININE 1.19* 1.04* 0.89 0.94 1.01*  CALCIUM 7.8* 8.1* 8.6* 8.4* 8.8*   Liver Function Tests: Recent Labs  Lab 07/28/20 2111 07/28/20 2257 07/29/20 0415  AST 51*  --  25  ALT 15  --  16  ALKPHOS 76  --  67  BILITOT 2.7* 1.9* 1.9*  PROT 6.8  --  6.1*  ALBUMIN 3.6  --  3.2*   CBG: Recent  Labs  Lab 08/03/20 1120 08/03/20 1544 08/03/20 1939 08/04/20 0645 08/04/20 1146  GLUCAP 367* 231* 88 89 273*    Time spent: 35 minutes  Signed:  Berle Mull  [facility]  08/04/2020 1:00 PM

## 2020-08-05 ENCOUNTER — Encounter (HOSPITAL_COMMUNITY): Payer: Self-pay | Admitting: Orthopedic Surgery

## 2020-08-06 DIAGNOSIS — E039 Hypothyroidism, unspecified: Secondary | ICD-10-CM | POA: Diagnosis not present

## 2020-08-06 DIAGNOSIS — N289 Disorder of kidney and ureter, unspecified: Secondary | ICD-10-CM | POA: Diagnosis not present

## 2020-08-06 DIAGNOSIS — D649 Anemia, unspecified: Secondary | ICD-10-CM | POA: Diagnosis not present

## 2020-08-06 DIAGNOSIS — R0789 Other chest pain: Secondary | ICD-10-CM | POA: Diagnosis not present

## 2020-08-06 DIAGNOSIS — E119 Type 2 diabetes mellitus without complications: Secondary | ICD-10-CM | POA: Diagnosis not present

## 2020-08-06 DIAGNOSIS — R5381 Other malaise: Secondary | ICD-10-CM | POA: Diagnosis not present

## 2020-08-06 DIAGNOSIS — I10 Essential (primary) hypertension: Secondary | ICD-10-CM | POA: Diagnosis not present

## 2020-08-06 DIAGNOSIS — K5900 Constipation, unspecified: Secondary | ICD-10-CM | POA: Diagnosis not present

## 2020-08-06 DIAGNOSIS — E875 Hyperkalemia: Secondary | ICD-10-CM | POA: Diagnosis not present

## 2020-08-07 DIAGNOSIS — N289 Disorder of kidney and ureter, unspecified: Secondary | ICD-10-CM | POA: Diagnosis not present

## 2020-08-07 DIAGNOSIS — R0789 Other chest pain: Secondary | ICD-10-CM | POA: Diagnosis not present

## 2020-08-07 DIAGNOSIS — E785 Hyperlipidemia, unspecified: Secondary | ICD-10-CM | POA: Diagnosis not present

## 2020-08-07 DIAGNOSIS — E119 Type 2 diabetes mellitus without complications: Secondary | ICD-10-CM | POA: Diagnosis not present

## 2020-08-07 DIAGNOSIS — E876 Hypokalemia: Secondary | ICD-10-CM | POA: Diagnosis not present

## 2020-08-07 DIAGNOSIS — E875 Hyperkalemia: Secondary | ICD-10-CM | POA: Diagnosis not present

## 2020-08-07 DIAGNOSIS — D649 Anemia, unspecified: Secondary | ICD-10-CM | POA: Diagnosis not present

## 2020-08-07 DIAGNOSIS — I10 Essential (primary) hypertension: Secondary | ICD-10-CM | POA: Diagnosis not present

## 2020-08-07 DIAGNOSIS — R42 Dizziness and giddiness: Secondary | ICD-10-CM | POA: Diagnosis not present

## 2020-08-07 DIAGNOSIS — E039 Hypothyroidism, unspecified: Secondary | ICD-10-CM | POA: Diagnosis not present

## 2020-08-13 DIAGNOSIS — R42 Dizziness and giddiness: Secondary | ICD-10-CM | POA: Diagnosis not present

## 2020-08-13 DIAGNOSIS — E785 Hyperlipidemia, unspecified: Secondary | ICD-10-CM | POA: Diagnosis not present

## 2020-08-13 DIAGNOSIS — E119 Type 2 diabetes mellitus without complications: Secondary | ICD-10-CM | POA: Diagnosis not present

## 2020-08-13 DIAGNOSIS — E039 Hypothyroidism, unspecified: Secondary | ICD-10-CM | POA: Diagnosis not present

## 2020-08-13 DIAGNOSIS — K5900 Constipation, unspecified: Secondary | ICD-10-CM | POA: Diagnosis not present

## 2020-08-13 DIAGNOSIS — D649 Anemia, unspecified: Secondary | ICD-10-CM | POA: Diagnosis not present

## 2020-08-13 DIAGNOSIS — I10 Essential (primary) hypertension: Secondary | ICD-10-CM | POA: Diagnosis not present

## 2020-08-13 DIAGNOSIS — E875 Hyperkalemia: Secondary | ICD-10-CM | POA: Diagnosis not present

## 2020-08-13 DIAGNOSIS — N289 Disorder of kidney and ureter, unspecified: Secondary | ICD-10-CM | POA: Diagnosis not present

## 2020-08-13 DIAGNOSIS — R5381 Other malaise: Secondary | ICD-10-CM | POA: Diagnosis not present

## 2020-08-13 DIAGNOSIS — R0789 Other chest pain: Secondary | ICD-10-CM | POA: Diagnosis not present

## 2020-08-18 DIAGNOSIS — E875 Hyperkalemia: Secondary | ICD-10-CM | POA: Diagnosis not present

## 2020-08-18 DIAGNOSIS — K5900 Constipation, unspecified: Secondary | ICD-10-CM | POA: Diagnosis not present

## 2020-08-18 DIAGNOSIS — E119 Type 2 diabetes mellitus without complications: Secondary | ICD-10-CM | POA: Diagnosis not present

## 2020-08-18 DIAGNOSIS — E785 Hyperlipidemia, unspecified: Secondary | ICD-10-CM | POA: Diagnosis not present

## 2020-08-18 DIAGNOSIS — D649 Anemia, unspecified: Secondary | ICD-10-CM | POA: Diagnosis not present

## 2020-08-18 DIAGNOSIS — Z471 Aftercare following joint replacement surgery: Secondary | ICD-10-CM | POA: Diagnosis not present

## 2020-08-18 DIAGNOSIS — E039 Hypothyroidism, unspecified: Secondary | ICD-10-CM | POA: Diagnosis not present

## 2020-08-18 DIAGNOSIS — N289 Disorder of kidney and ureter, unspecified: Secondary | ICD-10-CM | POA: Diagnosis not present

## 2020-08-18 DIAGNOSIS — I10 Essential (primary) hypertension: Secondary | ICD-10-CM | POA: Diagnosis not present

## 2020-08-18 DIAGNOSIS — E872 Acidosis: Secondary | ICD-10-CM | POA: Diagnosis not present

## 2020-08-18 DIAGNOSIS — R0789 Other chest pain: Secondary | ICD-10-CM | POA: Diagnosis not present

## 2020-08-18 DIAGNOSIS — R945 Abnormal results of liver function studies: Secondary | ICD-10-CM | POA: Diagnosis not present

## 2020-08-21 DIAGNOSIS — Z96642 Presence of left artificial hip joint: Secondary | ICD-10-CM | POA: Diagnosis not present

## 2020-08-21 DIAGNOSIS — E876 Hypokalemia: Secondary | ICD-10-CM | POA: Diagnosis not present

## 2020-08-21 DIAGNOSIS — E782 Mixed hyperlipidemia: Secondary | ICD-10-CM | POA: Diagnosis not present

## 2020-08-21 DIAGNOSIS — I10 Essential (primary) hypertension: Secondary | ICD-10-CM | POA: Diagnosis not present

## 2020-08-21 DIAGNOSIS — Z794 Long term (current) use of insulin: Secondary | ICD-10-CM | POA: Diagnosis not present

## 2020-08-21 DIAGNOSIS — E119 Type 2 diabetes mellitus without complications: Secondary | ICD-10-CM | POA: Diagnosis not present

## 2020-08-21 DIAGNOSIS — M6281 Muscle weakness (generalized): Secondary | ICD-10-CM | POA: Diagnosis not present

## 2020-08-21 DIAGNOSIS — Z471 Aftercare following joint replacement surgery: Secondary | ICD-10-CM | POA: Diagnosis not present

## 2020-08-21 DIAGNOSIS — R2689 Other abnormalities of gait and mobility: Secondary | ICD-10-CM | POA: Diagnosis not present

## 2020-08-21 DIAGNOSIS — E039 Hypothyroidism, unspecified: Secondary | ICD-10-CM | POA: Diagnosis not present

## 2020-08-26 DIAGNOSIS — R2689 Other abnormalities of gait and mobility: Secondary | ICD-10-CM | POA: Diagnosis not present

## 2020-08-26 DIAGNOSIS — E782 Mixed hyperlipidemia: Secondary | ICD-10-CM | POA: Diagnosis not present

## 2020-08-26 DIAGNOSIS — Z96642 Presence of left artificial hip joint: Secondary | ICD-10-CM | POA: Diagnosis not present

## 2020-08-26 DIAGNOSIS — E876 Hypokalemia: Secondary | ICD-10-CM | POA: Diagnosis not present

## 2020-08-26 DIAGNOSIS — I10 Essential (primary) hypertension: Secondary | ICD-10-CM | POA: Diagnosis not present

## 2020-08-26 DIAGNOSIS — E039 Hypothyroidism, unspecified: Secondary | ICD-10-CM | POA: Diagnosis not present

## 2020-08-26 DIAGNOSIS — Z471 Aftercare following joint replacement surgery: Secondary | ICD-10-CM | POA: Diagnosis not present

## 2020-08-26 DIAGNOSIS — E119 Type 2 diabetes mellitus without complications: Secondary | ICD-10-CM | POA: Diagnosis not present

## 2020-08-26 DIAGNOSIS — Z794 Long term (current) use of insulin: Secondary | ICD-10-CM | POA: Diagnosis not present

## 2020-08-26 DIAGNOSIS — M6281 Muscle weakness (generalized): Secondary | ICD-10-CM | POA: Diagnosis not present

## 2020-08-29 DIAGNOSIS — M6281 Muscle weakness (generalized): Secondary | ICD-10-CM | POA: Diagnosis not present

## 2020-08-29 DIAGNOSIS — Z471 Aftercare following joint replacement surgery: Secondary | ICD-10-CM | POA: Diagnosis not present

## 2020-08-29 DIAGNOSIS — Z96642 Presence of left artificial hip joint: Secondary | ICD-10-CM | POA: Diagnosis not present

## 2020-08-29 DIAGNOSIS — E119 Type 2 diabetes mellitus without complications: Secondary | ICD-10-CM | POA: Diagnosis not present

## 2020-08-29 DIAGNOSIS — Z794 Long term (current) use of insulin: Secondary | ICD-10-CM | POA: Diagnosis not present

## 2020-08-29 DIAGNOSIS — E876 Hypokalemia: Secondary | ICD-10-CM | POA: Diagnosis not present

## 2020-08-29 DIAGNOSIS — E782 Mixed hyperlipidemia: Secondary | ICD-10-CM | POA: Diagnosis not present

## 2020-08-29 DIAGNOSIS — I10 Essential (primary) hypertension: Secondary | ICD-10-CM | POA: Diagnosis not present

## 2020-08-29 DIAGNOSIS — R2689 Other abnormalities of gait and mobility: Secondary | ICD-10-CM | POA: Diagnosis not present

## 2020-08-29 DIAGNOSIS — E039 Hypothyroidism, unspecified: Secondary | ICD-10-CM | POA: Diagnosis not present

## 2020-09-02 DIAGNOSIS — I10 Essential (primary) hypertension: Secondary | ICD-10-CM | POA: Diagnosis not present

## 2020-09-02 DIAGNOSIS — Z794 Long term (current) use of insulin: Secondary | ICD-10-CM | POA: Diagnosis not present

## 2020-09-02 DIAGNOSIS — E119 Type 2 diabetes mellitus without complications: Secondary | ICD-10-CM | POA: Diagnosis not present

## 2020-09-02 DIAGNOSIS — E039 Hypothyroidism, unspecified: Secondary | ICD-10-CM | POA: Diagnosis not present

## 2020-09-02 DIAGNOSIS — R2689 Other abnormalities of gait and mobility: Secondary | ICD-10-CM | POA: Diagnosis not present

## 2020-09-02 DIAGNOSIS — E876 Hypokalemia: Secondary | ICD-10-CM | POA: Diagnosis not present

## 2020-09-02 DIAGNOSIS — M6281 Muscle weakness (generalized): Secondary | ICD-10-CM | POA: Diagnosis not present

## 2020-09-02 DIAGNOSIS — E782 Mixed hyperlipidemia: Secondary | ICD-10-CM | POA: Diagnosis not present

## 2020-09-02 DIAGNOSIS — Z96642 Presence of left artificial hip joint: Secondary | ICD-10-CM | POA: Diagnosis not present

## 2020-09-02 DIAGNOSIS — Z471 Aftercare following joint replacement surgery: Secondary | ICD-10-CM | POA: Diagnosis not present

## 2020-09-04 DIAGNOSIS — E876 Hypokalemia: Secondary | ICD-10-CM | POA: Diagnosis not present

## 2020-09-04 DIAGNOSIS — M6281 Muscle weakness (generalized): Secondary | ICD-10-CM | POA: Diagnosis not present

## 2020-09-04 DIAGNOSIS — Z96642 Presence of left artificial hip joint: Secondary | ICD-10-CM | POA: Diagnosis not present

## 2020-09-04 DIAGNOSIS — Z794 Long term (current) use of insulin: Secondary | ICD-10-CM | POA: Diagnosis not present

## 2020-09-04 DIAGNOSIS — I10 Essential (primary) hypertension: Secondary | ICD-10-CM | POA: Diagnosis not present

## 2020-09-04 DIAGNOSIS — E039 Hypothyroidism, unspecified: Secondary | ICD-10-CM | POA: Diagnosis not present

## 2020-09-04 DIAGNOSIS — E782 Mixed hyperlipidemia: Secondary | ICD-10-CM | POA: Diagnosis not present

## 2020-09-04 DIAGNOSIS — R2689 Other abnormalities of gait and mobility: Secondary | ICD-10-CM | POA: Diagnosis not present

## 2020-09-04 DIAGNOSIS — Z471 Aftercare following joint replacement surgery: Secondary | ICD-10-CM | POA: Diagnosis not present

## 2020-09-04 DIAGNOSIS — E119 Type 2 diabetes mellitus without complications: Secondary | ICD-10-CM | POA: Diagnosis not present

## 2020-09-09 DIAGNOSIS — Z794 Long term (current) use of insulin: Secondary | ICD-10-CM | POA: Diagnosis not present

## 2020-09-09 DIAGNOSIS — M6281 Muscle weakness (generalized): Secondary | ICD-10-CM | POA: Diagnosis not present

## 2020-09-09 DIAGNOSIS — E119 Type 2 diabetes mellitus without complications: Secondary | ICD-10-CM | POA: Diagnosis not present

## 2020-09-09 DIAGNOSIS — E876 Hypokalemia: Secondary | ICD-10-CM | POA: Diagnosis not present

## 2020-09-09 DIAGNOSIS — R2689 Other abnormalities of gait and mobility: Secondary | ICD-10-CM | POA: Diagnosis not present

## 2020-09-09 DIAGNOSIS — I10 Essential (primary) hypertension: Secondary | ICD-10-CM | POA: Diagnosis not present

## 2020-09-09 DIAGNOSIS — E039 Hypothyroidism, unspecified: Secondary | ICD-10-CM | POA: Diagnosis not present

## 2020-09-09 DIAGNOSIS — E782 Mixed hyperlipidemia: Secondary | ICD-10-CM | POA: Diagnosis not present

## 2020-09-09 DIAGNOSIS — Z96642 Presence of left artificial hip joint: Secondary | ICD-10-CM | POA: Diagnosis not present

## 2020-09-09 DIAGNOSIS — Z471 Aftercare following joint replacement surgery: Secondary | ICD-10-CM | POA: Diagnosis not present

## 2020-09-11 DIAGNOSIS — M25552 Pain in left hip: Secondary | ICD-10-CM | POA: Diagnosis not present

## 2020-09-12 DIAGNOSIS — R2689 Other abnormalities of gait and mobility: Secondary | ICD-10-CM | POA: Diagnosis not present

## 2020-09-12 DIAGNOSIS — M6281 Muscle weakness (generalized): Secondary | ICD-10-CM | POA: Diagnosis not present

## 2020-09-12 DIAGNOSIS — Z794 Long term (current) use of insulin: Secondary | ICD-10-CM | POA: Diagnosis not present

## 2020-09-12 DIAGNOSIS — I10 Essential (primary) hypertension: Secondary | ICD-10-CM | POA: Diagnosis not present

## 2020-09-12 DIAGNOSIS — E119 Type 2 diabetes mellitus without complications: Secondary | ICD-10-CM | POA: Diagnosis not present

## 2020-09-12 DIAGNOSIS — Z96642 Presence of left artificial hip joint: Secondary | ICD-10-CM | POA: Diagnosis not present

## 2020-09-12 DIAGNOSIS — E876 Hypokalemia: Secondary | ICD-10-CM | POA: Diagnosis not present

## 2020-09-12 DIAGNOSIS — Z471 Aftercare following joint replacement surgery: Secondary | ICD-10-CM | POA: Diagnosis not present

## 2020-09-12 DIAGNOSIS — E782 Mixed hyperlipidemia: Secondary | ICD-10-CM | POA: Diagnosis not present

## 2020-09-12 DIAGNOSIS — E039 Hypothyroidism, unspecified: Secondary | ICD-10-CM | POA: Diagnosis not present

## 2020-10-23 DIAGNOSIS — M25552 Pain in left hip: Secondary | ICD-10-CM | POA: Diagnosis not present

## 2021-01-23 DIAGNOSIS — E039 Hypothyroidism, unspecified: Secondary | ICD-10-CM | POA: Diagnosis not present

## 2021-01-23 DIAGNOSIS — R197 Diarrhea, unspecified: Secondary | ICD-10-CM | POA: Diagnosis not present

## 2021-01-23 DIAGNOSIS — E119 Type 2 diabetes mellitus without complications: Secondary | ICD-10-CM | POA: Diagnosis not present

## 2021-01-23 DIAGNOSIS — I10 Essential (primary) hypertension: Secondary | ICD-10-CM | POA: Diagnosis not present

## 2021-01-23 DIAGNOSIS — Z7984 Long term (current) use of oral hypoglycemic drugs: Secondary | ICD-10-CM | POA: Diagnosis not present

## 2021-01-23 DIAGNOSIS — Z794 Long term (current) use of insulin: Secondary | ICD-10-CM | POA: Diagnosis not present

## 2021-02-17 DIAGNOSIS — Z6824 Body mass index (BMI) 24.0-24.9, adult: Secondary | ICD-10-CM | POA: Diagnosis not present

## 2021-02-17 DIAGNOSIS — E039 Hypothyroidism, unspecified: Secondary | ICD-10-CM | POA: Diagnosis not present

## 2021-02-17 DIAGNOSIS — Z9049 Acquired absence of other specified parts of digestive tract: Secondary | ICD-10-CM | POA: Diagnosis not present

## 2021-02-24 DIAGNOSIS — J40 Bronchitis, not specified as acute or chronic: Secondary | ICD-10-CM | POA: Diagnosis not present

## 2021-02-27 DIAGNOSIS — J40 Bronchitis, not specified as acute or chronic: Secondary | ICD-10-CM | POA: Diagnosis not present

## 2021-02-27 DIAGNOSIS — R0981 Nasal congestion: Secondary | ICD-10-CM | POA: Diagnosis not present

## 2021-03-06 DIAGNOSIS — J329 Chronic sinusitis, unspecified: Secondary | ICD-10-CM | POA: Diagnosis not present

## 2021-03-06 DIAGNOSIS — R051 Acute cough: Secondary | ICD-10-CM | POA: Diagnosis not present

## 2021-05-28 IMAGING — CR DG HIP (WITH OR WITHOUT PELVIS) 2-3V*L*
3 series · 3 of 3 positions shown · non-contrast
Comparison: Acute abdominal series 08/10/2008

CLINICAL DATA: Fall

EXAM:
DG HIP (WITH OR WITHOUT PELVIS) 2-3V LEFT

[pelvis ap]
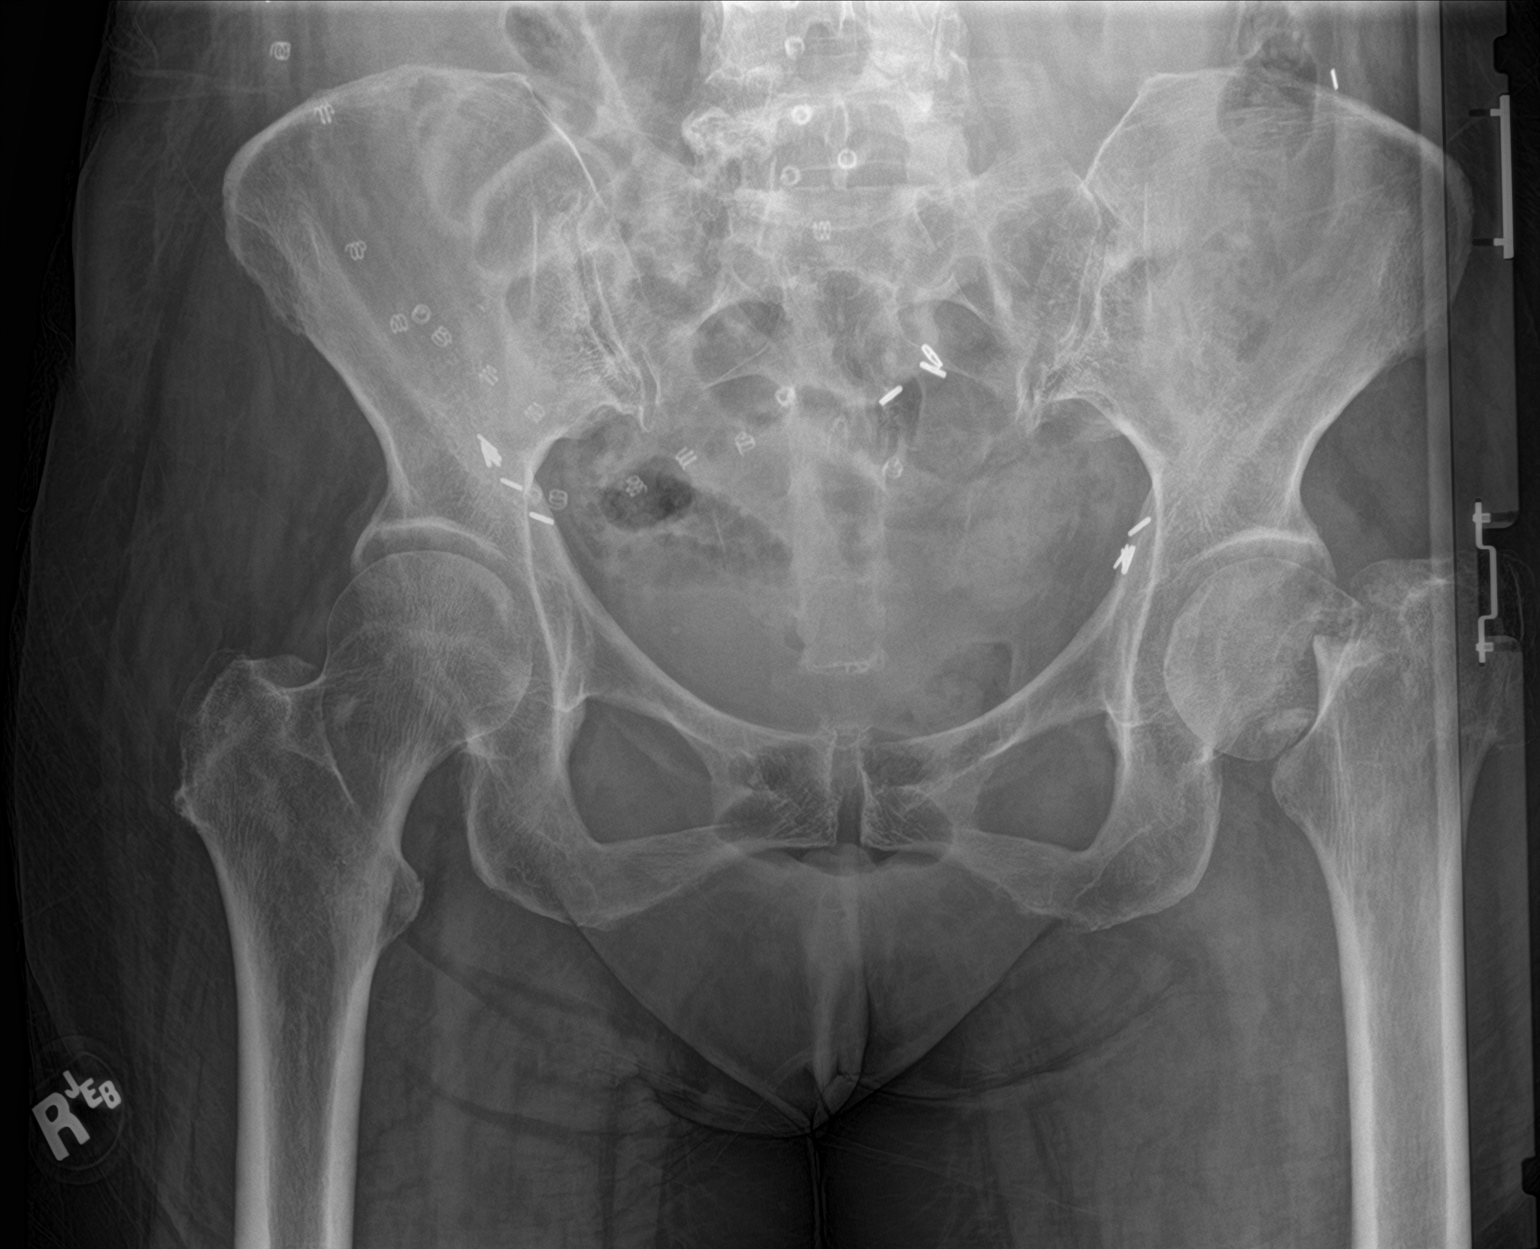

[hip ap]
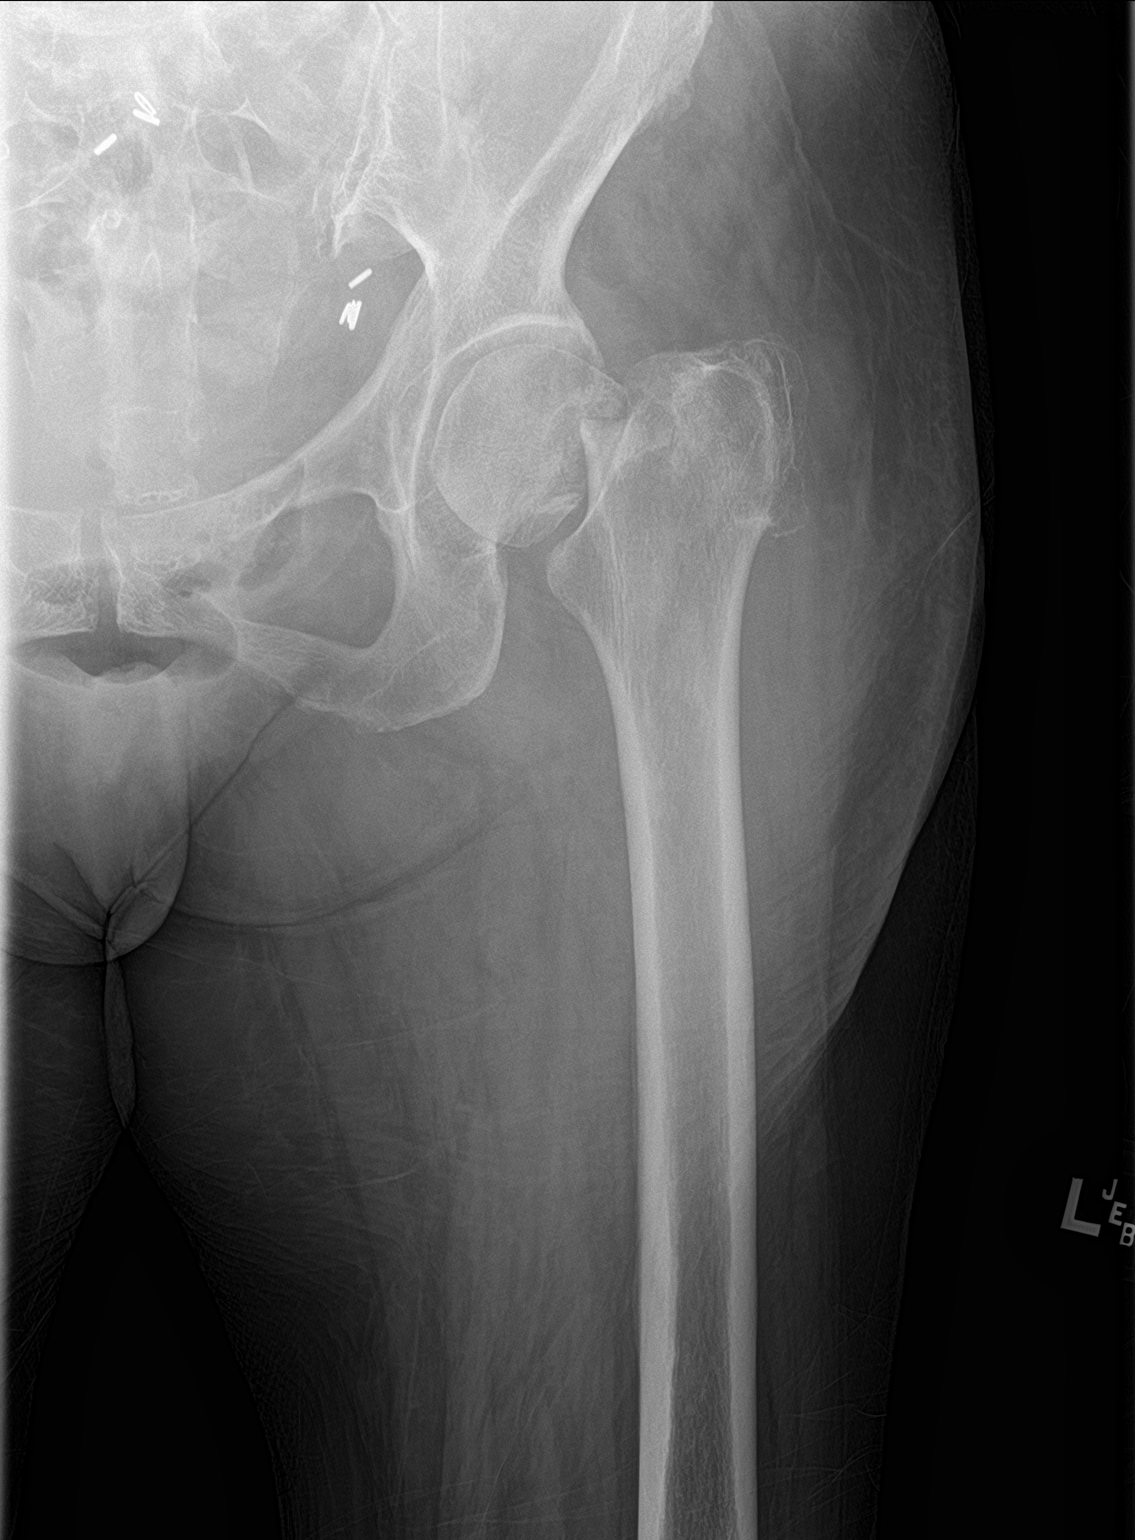

[hip x-table]
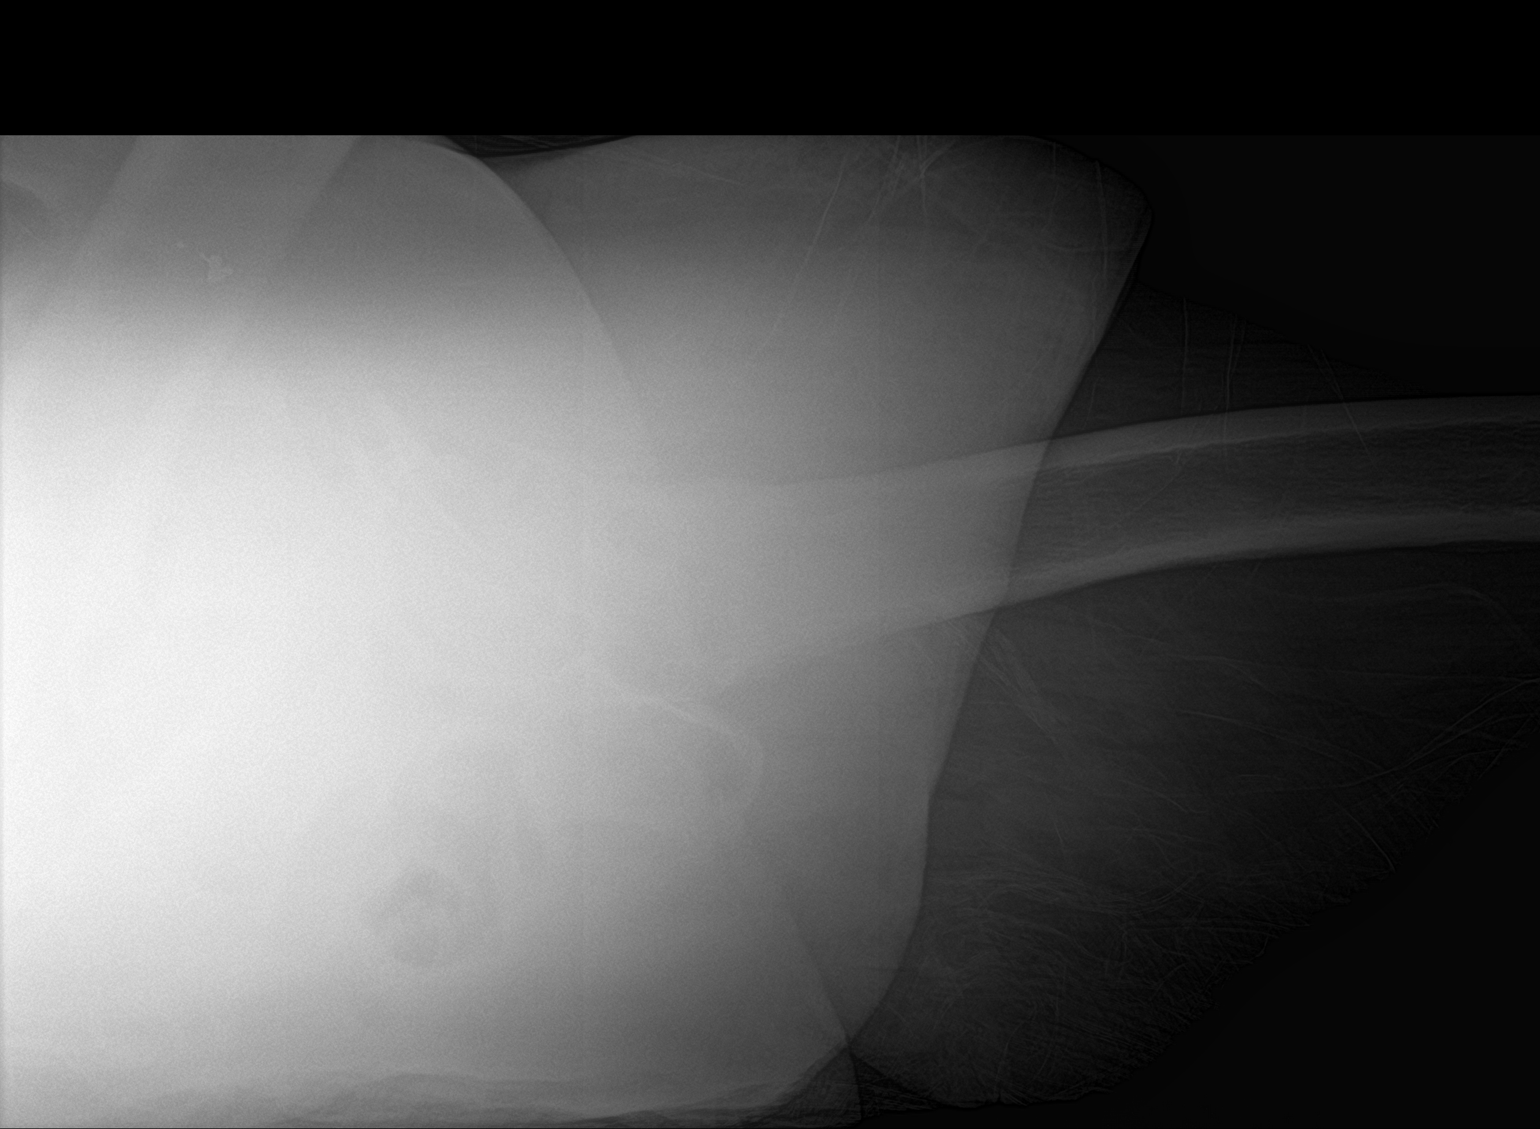

[3 of 3 positions shown; findings below may reference images not displayed]

FINDINGS: There is a transcervical left femoral neck fracture with
foreshortening across the fracture line. Questionable fragmentation
versus enthesopathic change involving the greater trochanter as
well. Surrounding soft tissue swelling is noted. No other acute
fracture or traumatic osseous injury is seen involving the bones of
the pelvis or the contralateral proximal right femur. Femoral heads
remain normally located. Degenerative changes in the spine, hips and
pelvis. Prior right lower abdominal hernia mesh repair. Additional
surgical clips in the pelvis.
IMPRESSION: 1. Transcervical left femoral neck fracture with foreshortening
across the fracture line.
2. Questionable fragmentation versus enthesopathic change involving
the left greater trochanter as well.

## 2021-05-30 IMAGING — DX DG PORTABLE PELVIS
1 series · 1 of 1 positions shown · non-contrast
Comparison: Preoperative radiograph 07/28/2020

CLINICAL DATA: Postop left hip replacement.

EXAM:
PORTABLE PELVIS 1-2 VIEWS

[pelvis ap]
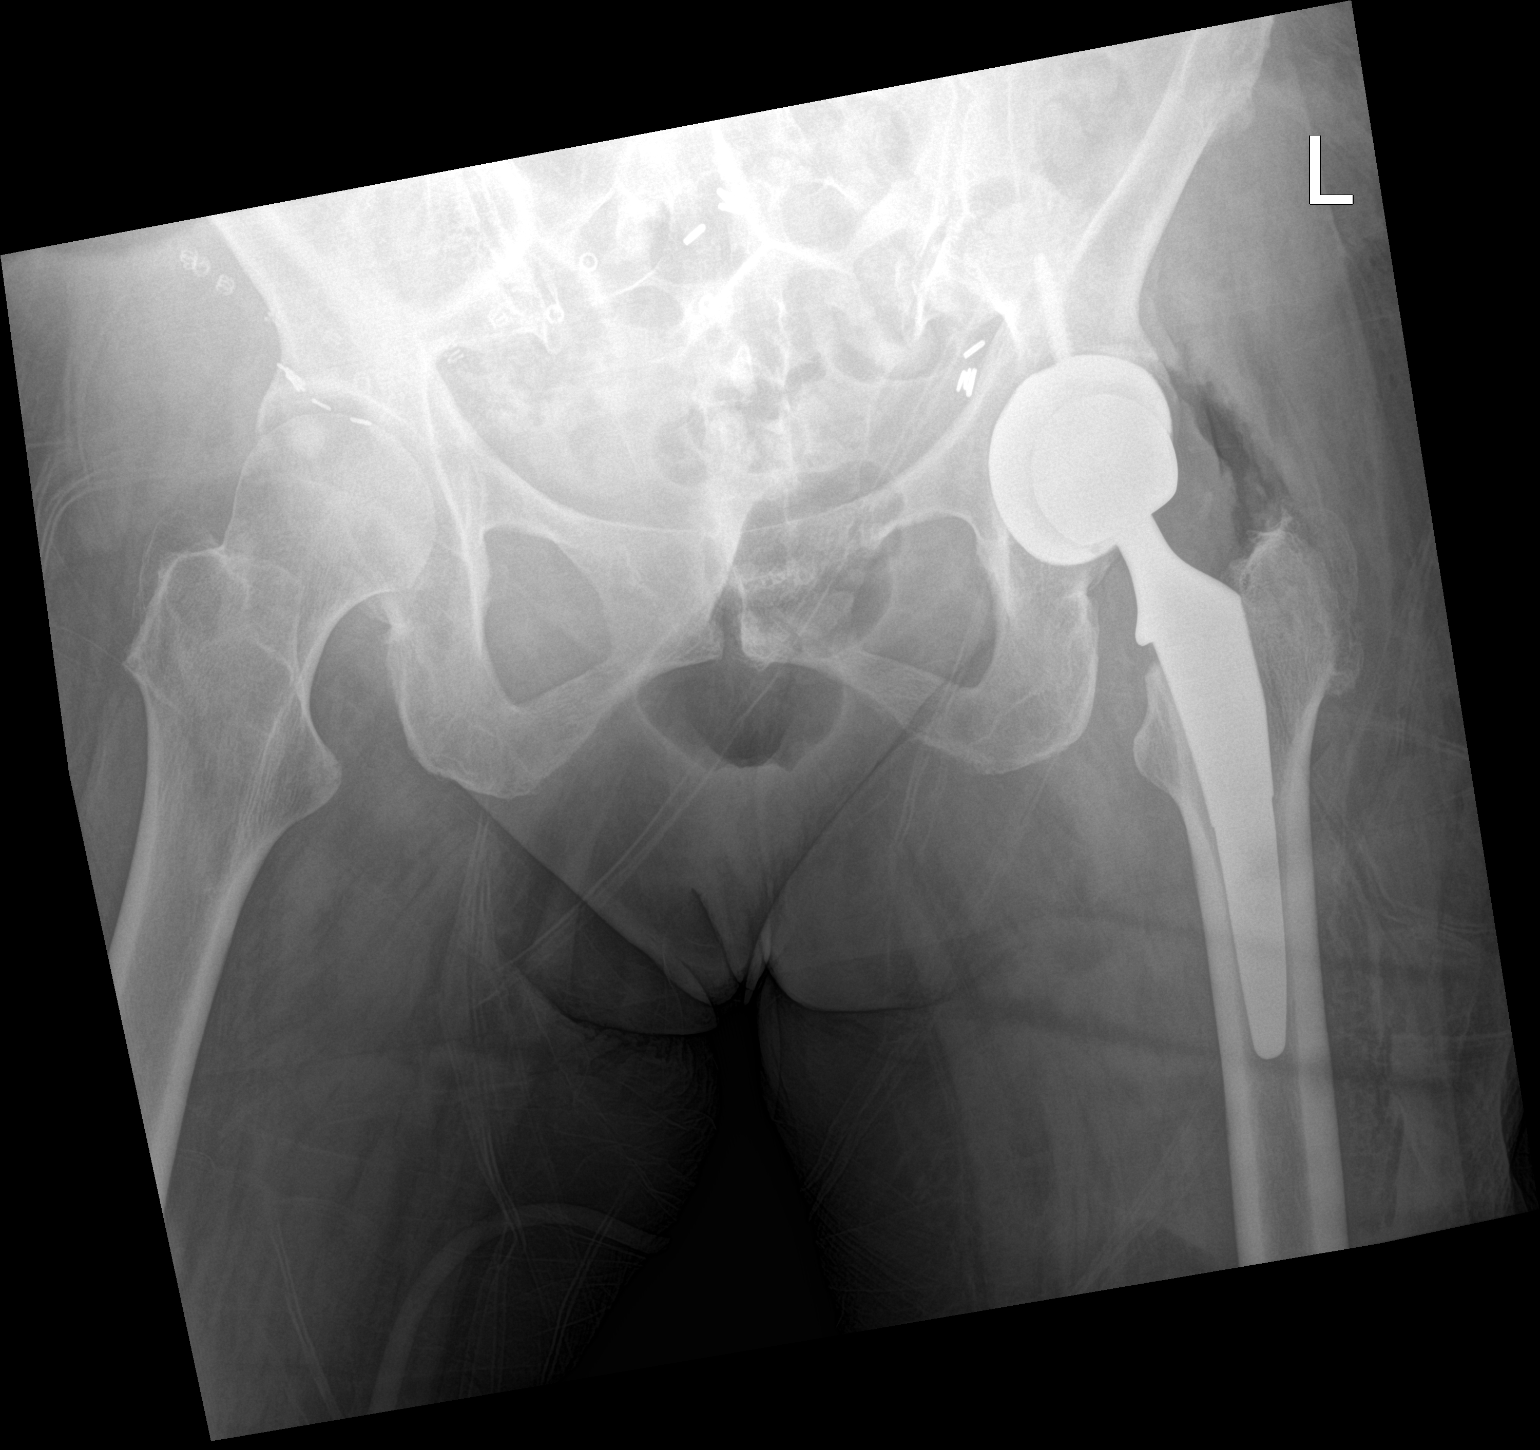

[1 of 1 positions shown; findings below may reference images not displayed]

FINDINGS: Left hip arthroplasty in expected alignment. No periprosthetic
lucency. Recent postsurgical change includes air and edema in the
joint space and soft tissues. Pubic rami are intact.
IMPRESSION: Left hip arthroplasty without immediate postoperative complication.

## 2021-06-02 IMAGING — DX DG ABD PORTABLE 1V
1 series · 1 of 1 positions shown · non-contrast
Comparison: August 10, 2008

CLINICAL DATA: Fall with left hip and chest pain.

EXAM:
PORTABLE ABDOMEN - 1 VIEW

[abdomen kub]
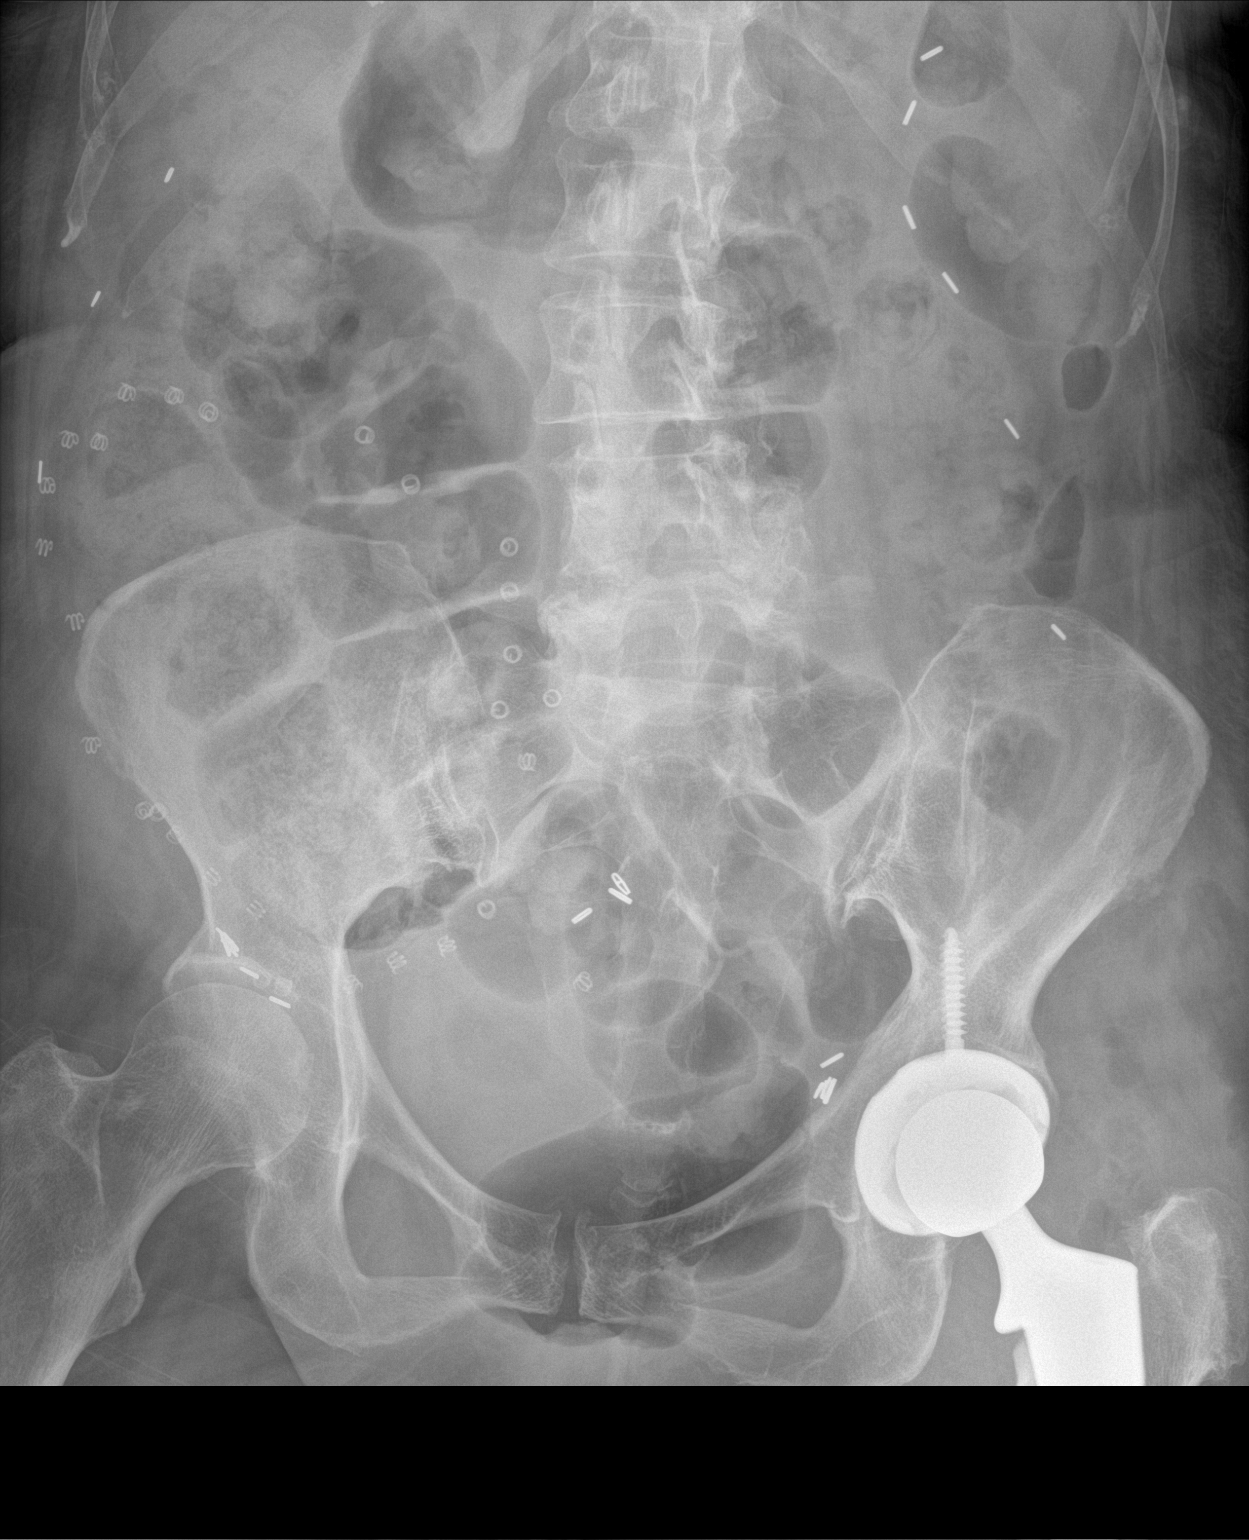

[1 of 1 positions shown; findings below may reference images not displayed]

FINDINGS: Hernia Omeisha is at identified. The bowel gas pattern is
nonobstructive. The patient is status post left hip replacement. No
acute abnormalities identified.
IMPRESSION: No acute abnormalities identified.

## 2021-06-02 IMAGING — DX DG STERNUM 2+V
2 series · 2 of 2 positions shown · non-contrast
Comparison: Chest radiograph-07/28/2020

CLINICAL DATA: Chest pain.

EXAM:
STERNUM - 2+ VIEW

[sternum lat]
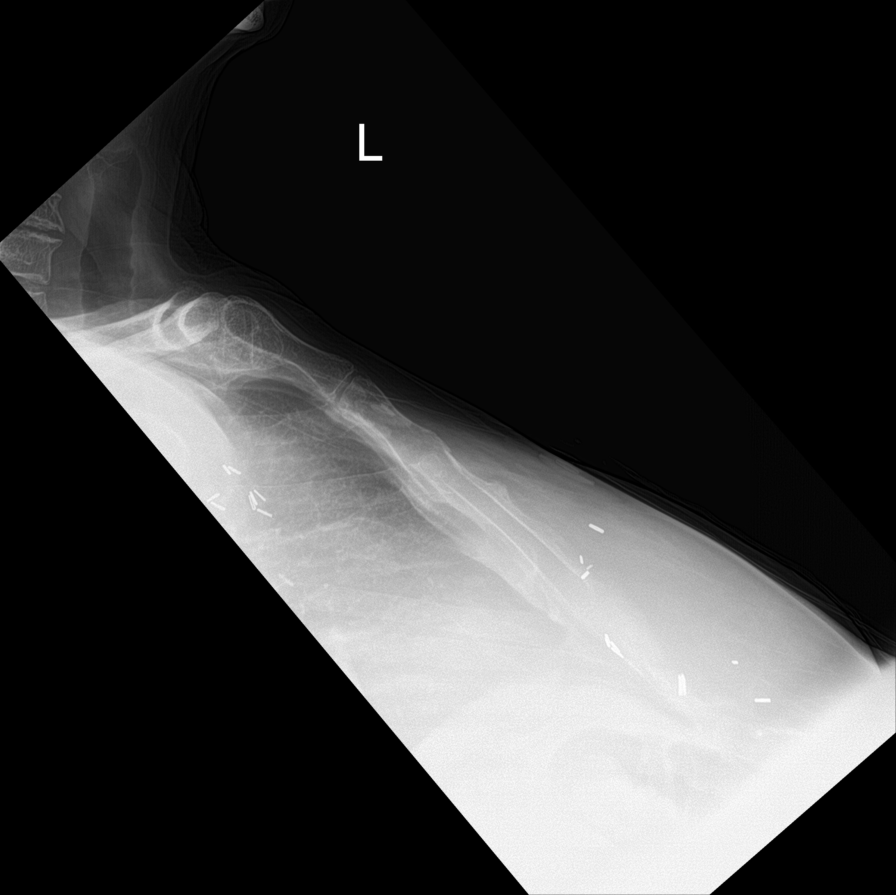

[chest ap]
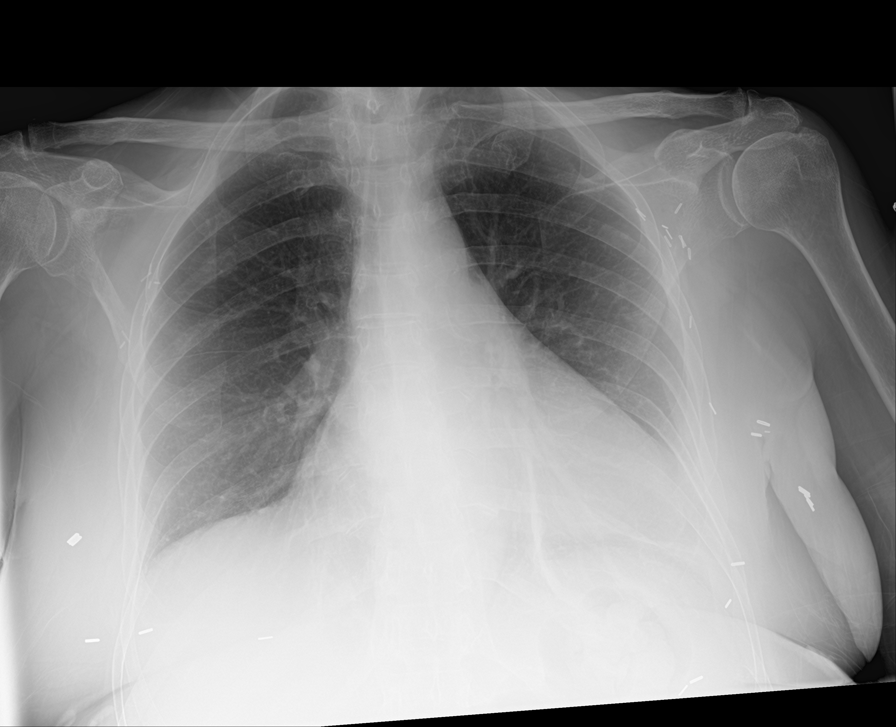

[2 of 2 positions shown; findings below may reference images not displayed]

FINDINGS: Grossly unchanged enlarged cardiac silhouette and mediastinal
contours. Left basilar/retrocardiac linear heterogeneous opacities
are unchanged favored to represent atelectasis. No new focal
airspace opacities. No pleural effusion or pneumothorax. No evidence
of edema.

No definite displaced sternal fracture. Sequela of previous
mastectomy and bilateral axillary adenectomy. No radiopaque foreign
body.
IMPRESSION: 1. No definite displaced sternal fracture
2. Similar findings of cardiomegaly and left basilar atelectasis
without superimposed acute cardiopulmonary disease.

## 2021-11-18 DIAGNOSIS — M25552 Pain in left hip: Secondary | ICD-10-CM | POA: Diagnosis not present

## 2021-12-01 DIAGNOSIS — I10 Essential (primary) hypertension: Secondary | ICD-10-CM | POA: Diagnosis not present

## 2021-12-01 DIAGNOSIS — Z794 Long term (current) use of insulin: Secondary | ICD-10-CM | POA: Diagnosis not present

## 2021-12-01 DIAGNOSIS — E782 Mixed hyperlipidemia: Secondary | ICD-10-CM | POA: Diagnosis not present

## 2021-12-01 DIAGNOSIS — E1165 Type 2 diabetes mellitus with hyperglycemia: Secondary | ICD-10-CM | POA: Diagnosis not present

## 2021-12-01 DIAGNOSIS — N1832 Chronic kidney disease, stage 3b: Secondary | ICD-10-CM | POA: Diagnosis not present

## 2021-12-01 DIAGNOSIS — E039 Hypothyroidism, unspecified: Secondary | ICD-10-CM | POA: Diagnosis not present

## 2022-01-21 DIAGNOSIS — E782 Mixed hyperlipidemia: Secondary | ICD-10-CM | POA: Diagnosis not present

## 2022-01-21 DIAGNOSIS — E1122 Type 2 diabetes mellitus with diabetic chronic kidney disease: Secondary | ICD-10-CM | POA: Diagnosis not present

## 2022-01-21 DIAGNOSIS — I10 Essential (primary) hypertension: Secondary | ICD-10-CM | POA: Diagnosis not present

## 2022-01-21 DIAGNOSIS — N1832 Chronic kidney disease, stage 3b: Secondary | ICD-10-CM | POA: Diagnosis not present

## 2022-02-15 DIAGNOSIS — Z0000 Encounter for general adult medical examination without abnormal findings: Secondary | ICD-10-CM | POA: Diagnosis not present

## 2022-02-15 DIAGNOSIS — Z1211 Encounter for screening for malignant neoplasm of colon: Secondary | ICD-10-CM | POA: Diagnosis not present

## 2022-02-15 DIAGNOSIS — E1165 Type 2 diabetes mellitus with hyperglycemia: Secondary | ICD-10-CM | POA: Diagnosis not present

## 2022-02-15 DIAGNOSIS — Z853 Personal history of malignant neoplasm of breast: Secondary | ICD-10-CM | POA: Diagnosis not present

## 2022-02-15 DIAGNOSIS — Z1231 Encounter for screening mammogram for malignant neoplasm of breast: Secondary | ICD-10-CM | POA: Diagnosis not present

## 2022-02-15 DIAGNOSIS — I10 Essential (primary) hypertension: Secondary | ICD-10-CM | POA: Diagnosis not present

## 2022-02-15 DIAGNOSIS — E782 Mixed hyperlipidemia: Secondary | ICD-10-CM | POA: Diagnosis not present

## 2022-02-15 DIAGNOSIS — Z794 Long term (current) use of insulin: Secondary | ICD-10-CM | POA: Diagnosis not present

## 2022-02-15 DIAGNOSIS — E039 Hypothyroidism, unspecified: Secondary | ICD-10-CM | POA: Diagnosis not present

## 2022-02-15 DIAGNOSIS — Z13820 Encounter for screening for osteoporosis: Secondary | ICD-10-CM | POA: Diagnosis not present

## 2022-02-15 DIAGNOSIS — Z7185 Encounter for immunization safety counseling: Secondary | ICD-10-CM | POA: Diagnosis not present

## 2022-05-14 DIAGNOSIS — N183 Chronic kidney disease, stage 3 unspecified: Secondary | ICD-10-CM | POA: Diagnosis not present

## 2022-05-14 DIAGNOSIS — E1122 Type 2 diabetes mellitus with diabetic chronic kidney disease: Secondary | ICD-10-CM | POA: Diagnosis not present

## 2022-05-14 DIAGNOSIS — E782 Mixed hyperlipidemia: Secondary | ICD-10-CM | POA: Diagnosis not present

## 2022-05-14 DIAGNOSIS — I10 Essential (primary) hypertension: Secondary | ICD-10-CM | POA: Diagnosis not present

## 2022-05-14 DIAGNOSIS — E039 Hypothyroidism, unspecified: Secondary | ICD-10-CM | POA: Diagnosis not present

## 2022-05-30 DIAGNOSIS — H2511 Age-related nuclear cataract, right eye: Secondary | ICD-10-CM | POA: Diagnosis not present

## 2022-06-07 DIAGNOSIS — E039 Hypothyroidism, unspecified: Secondary | ICD-10-CM | POA: Diagnosis not present

## 2022-06-07 DIAGNOSIS — I10 Essential (primary) hypertension: Secondary | ICD-10-CM | POA: Diagnosis not present

## 2022-06-07 DIAGNOSIS — E1065 Type 1 diabetes mellitus with hyperglycemia: Secondary | ICD-10-CM | POA: Diagnosis not present

## 2022-06-08 DIAGNOSIS — E1065 Type 1 diabetes mellitus with hyperglycemia: Secondary | ICD-10-CM | POA: Diagnosis not present

## 2022-06-08 DIAGNOSIS — E039 Hypothyroidism, unspecified: Secondary | ICD-10-CM | POA: Diagnosis not present

## 2022-06-16 DIAGNOSIS — E1065 Type 1 diabetes mellitus with hyperglycemia: Secondary | ICD-10-CM | POA: Diagnosis not present

## 2022-06-16 DIAGNOSIS — I10 Essential (primary) hypertension: Secondary | ICD-10-CM | POA: Diagnosis not present

## 2022-06-16 DIAGNOSIS — E039 Hypothyroidism, unspecified: Secondary | ICD-10-CM | POA: Diagnosis not present

## 2022-07-15 DIAGNOSIS — E039 Hypothyroidism, unspecified: Secondary | ICD-10-CM | POA: Diagnosis not present

## 2022-07-15 DIAGNOSIS — E1065 Type 1 diabetes mellitus with hyperglycemia: Secondary | ICD-10-CM | POA: Diagnosis not present

## 2022-09-14 DIAGNOSIS — E039 Hypothyroidism, unspecified: Secondary | ICD-10-CM | POA: Diagnosis not present

## 2022-09-14 DIAGNOSIS — I10 Essential (primary) hypertension: Secondary | ICD-10-CM | POA: Diagnosis not present

## 2022-09-14 DIAGNOSIS — E1065 Type 1 diabetes mellitus with hyperglycemia: Secondary | ICD-10-CM | POA: Diagnosis not present

## 2022-11-29 DIAGNOSIS — R300 Dysuria: Secondary | ICD-10-CM | POA: Diagnosis not present

## 2022-11-29 DIAGNOSIS — N3001 Acute cystitis with hematuria: Secondary | ICD-10-CM | POA: Diagnosis not present

## 2022-12-10 DIAGNOSIS — E1065 Type 1 diabetes mellitus with hyperglycemia: Secondary | ICD-10-CM | POA: Diagnosis not present

## 2022-12-10 DIAGNOSIS — E039 Hypothyroidism, unspecified: Secondary | ICD-10-CM | POA: Diagnosis not present

## 2022-12-17 DIAGNOSIS — E039 Hypothyroidism, unspecified: Secondary | ICD-10-CM | POA: Diagnosis not present

## 2022-12-17 DIAGNOSIS — E1065 Type 1 diabetes mellitus with hyperglycemia: Secondary | ICD-10-CM | POA: Diagnosis not present

## 2022-12-17 DIAGNOSIS — I10 Essential (primary) hypertension: Secondary | ICD-10-CM | POA: Diagnosis not present

## 2023-01-10 DIAGNOSIS — H26492 Other secondary cataract, left eye: Secondary | ICD-10-CM | POA: Diagnosis not present

## 2023-01-10 DIAGNOSIS — E113292 Type 2 diabetes mellitus with mild nonproliferative diabetic retinopathy without macular edema, left eye: Secondary | ICD-10-CM | POA: Diagnosis not present

## 2023-01-10 DIAGNOSIS — H2511 Age-related nuclear cataract, right eye: Secondary | ICD-10-CM | POA: Diagnosis not present

## 2023-01-11 DIAGNOSIS — R300 Dysuria: Secondary | ICD-10-CM | POA: Diagnosis not present

## 2023-04-05 DIAGNOSIS — Z961 Presence of intraocular lens: Secondary | ICD-10-CM | POA: Diagnosis not present

## 2023-04-05 DIAGNOSIS — H26492 Other secondary cataract, left eye: Secondary | ICD-10-CM | POA: Diagnosis not present

## 2023-04-05 DIAGNOSIS — H18413 Arcus senilis, bilateral: Secondary | ICD-10-CM | POA: Diagnosis not present

## 2023-04-05 DIAGNOSIS — H2511 Age-related nuclear cataract, right eye: Secondary | ICD-10-CM | POA: Diagnosis not present

## 2023-04-13 DIAGNOSIS — H26492 Other secondary cataract, left eye: Secondary | ICD-10-CM | POA: Diagnosis not present

## 2023-04-18 DIAGNOSIS — E1065 Type 1 diabetes mellitus with hyperglycemia: Secondary | ICD-10-CM | POA: Diagnosis not present

## 2023-04-20 DIAGNOSIS — E1165 Type 2 diabetes mellitus with hyperglycemia: Secondary | ICD-10-CM | POA: Diagnosis not present

## 2023-04-20 DIAGNOSIS — E1122 Type 2 diabetes mellitus with diabetic chronic kidney disease: Secondary | ICD-10-CM | POA: Diagnosis not present

## 2023-04-20 DIAGNOSIS — N1832 Chronic kidney disease, stage 3b: Secondary | ICD-10-CM | POA: Diagnosis not present

## 2023-04-20 DIAGNOSIS — Z0000 Encounter for general adult medical examination without abnormal findings: Secondary | ICD-10-CM | POA: Diagnosis not present

## 2023-04-20 DIAGNOSIS — Z794 Long term (current) use of insulin: Secondary | ICD-10-CM | POA: Diagnosis not present

## 2023-04-20 DIAGNOSIS — R300 Dysuria: Secondary | ICD-10-CM | POA: Diagnosis not present

## 2023-04-20 DIAGNOSIS — H25091 Other age-related incipient cataract, right eye: Secondary | ICD-10-CM | POA: Diagnosis not present

## 2023-04-20 DIAGNOSIS — J439 Emphysema, unspecified: Secondary | ICD-10-CM | POA: Diagnosis not present

## 2023-04-20 DIAGNOSIS — Z7185 Encounter for immunization safety counseling: Secondary | ICD-10-CM | POA: Diagnosis not present

## 2023-04-20 DIAGNOSIS — E039 Hypothyroidism, unspecified: Secondary | ICD-10-CM | POA: Diagnosis not present

## 2023-04-25 DIAGNOSIS — I10 Essential (primary) hypertension: Secondary | ICD-10-CM | POA: Diagnosis not present

## 2023-04-25 DIAGNOSIS — E039 Hypothyroidism, unspecified: Secondary | ICD-10-CM | POA: Diagnosis not present

## 2023-04-25 DIAGNOSIS — E1065 Type 1 diabetes mellitus with hyperglycemia: Secondary | ICD-10-CM | POA: Diagnosis not present

## 2023-05-23 DIAGNOSIS — E039 Hypothyroidism, unspecified: Secondary | ICD-10-CM | POA: Diagnosis not present

## 2023-05-23 DIAGNOSIS — E1065 Type 1 diabetes mellitus with hyperglycemia: Secondary | ICD-10-CM | POA: Diagnosis not present

## 2023-05-23 DIAGNOSIS — I10 Essential (primary) hypertension: Secondary | ICD-10-CM | POA: Diagnosis not present

## 2023-05-23 DIAGNOSIS — Z87440 Personal history of urinary (tract) infections: Secondary | ICD-10-CM | POA: Diagnosis not present

## 2023-05-23 DIAGNOSIS — N1831 Chronic kidney disease, stage 3a: Secondary | ICD-10-CM | POA: Diagnosis not present

## 2023-05-27 DIAGNOSIS — I10 Essential (primary) hypertension: Secondary | ICD-10-CM | POA: Diagnosis not present

## 2023-05-27 DIAGNOSIS — N1831 Chronic kidney disease, stage 3a: Secondary | ICD-10-CM | POA: Diagnosis not present

## 2023-05-27 DIAGNOSIS — E1065 Type 1 diabetes mellitus with hyperglycemia: Secondary | ICD-10-CM | POA: Diagnosis not present

## 2023-05-30 DIAGNOSIS — H2511 Age-related nuclear cataract, right eye: Secondary | ICD-10-CM | POA: Diagnosis not present

## 2023-05-30 DIAGNOSIS — H52209 Unspecified astigmatism, unspecified eye: Secondary | ICD-10-CM | POA: Diagnosis not present

## 2023-08-31 DIAGNOSIS — E039 Hypothyroidism, unspecified: Secondary | ICD-10-CM | POA: Diagnosis not present

## 2023-08-31 DIAGNOSIS — E1065 Type 1 diabetes mellitus with hyperglycemia: Secondary | ICD-10-CM | POA: Diagnosis not present

## 2023-08-31 DIAGNOSIS — N1831 Chronic kidney disease, stage 3a: Secondary | ICD-10-CM | POA: Diagnosis not present

## 2023-09-07 DIAGNOSIS — D649 Anemia, unspecified: Secondary | ICD-10-CM | POA: Diagnosis not present

## 2023-09-07 DIAGNOSIS — Z9049 Acquired absence of other specified parts of digestive tract: Secondary | ICD-10-CM | POA: Diagnosis not present

## 2023-09-07 DIAGNOSIS — I10 Essential (primary) hypertension: Secondary | ICD-10-CM | POA: Diagnosis not present

## 2023-09-07 DIAGNOSIS — N1831 Chronic kidney disease, stage 3a: Secondary | ICD-10-CM | POA: Diagnosis not present

## 2023-09-07 DIAGNOSIS — E139 Other specified diabetes mellitus without complications: Secondary | ICD-10-CM | POA: Diagnosis not present

## 2023-09-07 DIAGNOSIS — E039 Hypothyroidism, unspecified: Secondary | ICD-10-CM | POA: Diagnosis not present

## 2023-09-08 DIAGNOSIS — E139 Other specified diabetes mellitus without complications: Secondary | ICD-10-CM | POA: Diagnosis not present

## 2023-09-08 DIAGNOSIS — E1065 Type 1 diabetes mellitus with hyperglycemia: Secondary | ICD-10-CM | POA: Diagnosis not present

## 2023-09-08 DIAGNOSIS — E039 Hypothyroidism, unspecified: Secondary | ICD-10-CM | POA: Diagnosis not present

## 2023-09-08 DIAGNOSIS — I10 Essential (primary) hypertension: Secondary | ICD-10-CM | POA: Diagnosis not present

## 2023-09-08 DIAGNOSIS — N1831 Chronic kidney disease, stage 3a: Secondary | ICD-10-CM | POA: Diagnosis not present

## 2023-11-22 DIAGNOSIS — R051 Acute cough: Secondary | ICD-10-CM | POA: Diagnosis not present

## 2023-11-22 DIAGNOSIS — R42 Dizziness and giddiness: Secondary | ICD-10-CM | POA: Diagnosis not present

## 2023-11-22 DIAGNOSIS — R0602 Shortness of breath: Secondary | ICD-10-CM | POA: Diagnosis not present

## 2023-11-22 DIAGNOSIS — R0989 Other specified symptoms and signs involving the circulatory and respiratory systems: Secondary | ICD-10-CM | POA: Diagnosis not present

## 2023-12-07 DIAGNOSIS — E1065 Type 1 diabetes mellitus with hyperglycemia: Secondary | ICD-10-CM | POA: Diagnosis not present

## 2023-12-07 DIAGNOSIS — E039 Hypothyroidism, unspecified: Secondary | ICD-10-CM | POA: Diagnosis not present

## 2023-12-14 DIAGNOSIS — E1065 Type 1 diabetes mellitus with hyperglycemia: Secondary | ICD-10-CM | POA: Diagnosis not present

## 2023-12-14 DIAGNOSIS — I10 Essential (primary) hypertension: Secondary | ICD-10-CM | POA: Diagnosis not present

## 2023-12-14 DIAGNOSIS — D649 Anemia, unspecified: Secondary | ICD-10-CM | POA: Diagnosis not present

## 2023-12-14 DIAGNOSIS — N1831 Chronic kidney disease, stage 3a: Secondary | ICD-10-CM | POA: Diagnosis not present

## 2023-12-14 DIAGNOSIS — E039 Hypothyroidism, unspecified: Secondary | ICD-10-CM | POA: Diagnosis not present

## 2023-12-14 DIAGNOSIS — E139 Other specified diabetes mellitus without complications: Secondary | ICD-10-CM | POA: Diagnosis not present

## 2024-01-11 DIAGNOSIS — H26491 Other secondary cataract, right eye: Secondary | ICD-10-CM | POA: Diagnosis not present

## 2024-01-11 DIAGNOSIS — E113293 Type 2 diabetes mellitus with mild nonproliferative diabetic retinopathy without macular edema, bilateral: Secondary | ICD-10-CM | POA: Diagnosis not present

## 2024-02-02 DIAGNOSIS — H02831 Dermatochalasis of right upper eyelid: Secondary | ICD-10-CM | POA: Diagnosis not present

## 2024-02-02 DIAGNOSIS — H26491 Other secondary cataract, right eye: Secondary | ICD-10-CM | POA: Diagnosis not present

## 2024-02-02 DIAGNOSIS — Z961 Presence of intraocular lens: Secondary | ICD-10-CM | POA: Diagnosis not present

## 2024-02-02 DIAGNOSIS — H18413 Arcus senilis, bilateral: Secondary | ICD-10-CM | POA: Diagnosis not present

## 2024-02-13 DIAGNOSIS — H26491 Other secondary cataract, right eye: Secondary | ICD-10-CM | POA: Diagnosis not present

## 2024-03-15 DIAGNOSIS — N1831 Chronic kidney disease, stage 3a: Secondary | ICD-10-CM | POA: Diagnosis not present

## 2024-03-15 DIAGNOSIS — E1065 Type 1 diabetes mellitus with hyperglycemia: Secondary | ICD-10-CM | POA: Diagnosis not present

## 2024-03-15 DIAGNOSIS — E139 Other specified diabetes mellitus without complications: Secondary | ICD-10-CM | POA: Diagnosis not present

## 2024-03-15 DIAGNOSIS — I10 Essential (primary) hypertension: Secondary | ICD-10-CM | POA: Diagnosis not present

## 2024-03-15 DIAGNOSIS — E039 Hypothyroidism, unspecified: Secondary | ICD-10-CM | POA: Diagnosis not present
